# Patient Record
Sex: Male | Born: 1960 | Race: Black or African American | Hispanic: No | Marital: Single | Smoking: Never smoker
Health system: Southern US, Community
[De-identification: ages and names within clinical notes are randomized; demographics above are authoritative.]

## PROBLEM LIST (undated history)

## (undated) DIAGNOSIS — I313 Pericardial effusion (noninflammatory): Secondary | ICD-10-CM

## (undated) DIAGNOSIS — J189 Pneumonia, unspecified organism: Secondary | ICD-10-CM

## (undated) HISTORY — PX: INGUINAL HERNIA REPAIR: SUR1180

---

## 1998-12-17 ENCOUNTER — Ambulatory Visit (HOSPITAL_BASED_OUTPATIENT_CLINIC_OR_DEPARTMENT_OTHER): Admission: RE | Admit: 1998-12-17 | Discharge: 1998-12-17 | Payer: Self-pay

## 2001-07-14 ENCOUNTER — Encounter: Payer: Self-pay | Admitting: Emergency Medicine

## 2001-07-14 ENCOUNTER — Emergency Department (HOSPITAL_COMMUNITY): Admission: EM | Admit: 2001-07-14 | Discharge: 2001-07-14 | Payer: Self-pay

## 2011-11-01 ENCOUNTER — Ambulatory Visit (INDEPENDENT_AMBULATORY_CARE_PROVIDER_SITE_OTHER): Payer: 59 | Admitting: Family Medicine

## 2011-11-01 VITALS — BP 122/78 | HR 108 | Temp 97.6°F | Resp 16 | Ht 62.75 in | Wt 144.4 lb

## 2011-11-01 DIAGNOSIS — R51 Headache: Secondary | ICD-10-CM

## 2011-11-01 DIAGNOSIS — R220 Localized swelling, mass and lump, head: Secondary | ICD-10-CM

## 2011-11-01 DIAGNOSIS — R221 Localized swelling, mass and lump, neck: Secondary | ICD-10-CM

## 2011-11-01 NOTE — Progress Notes (Signed)
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  Progressive. One episode of bleeding  O:  Verrucous 4 mm lesion on right occipital scalp.  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

## 2011-11-01 NOTE — Patient Instructions (Signed)

## 2011-11-01 NOTE — Progress Notes (Signed)
VCO.  Local anesthesia with 1.5 cc 2% lidocaine with epi.  SP&D.  Elliptical excision with 11 blade.  Undermining with curved hemostats and scissors.  Closed with 4-0 Prolene, 1 HM, 1 VM and 3 SI sutures.  Cleansed and dressed.

## 2011-11-08 ENCOUNTER — Ambulatory Visit (INDEPENDENT_AMBULATORY_CARE_PROVIDER_SITE_OTHER): Payer: 59 | Admitting: Family Medicine

## 2011-11-08 VITALS — BP 112/76 | HR 76 | Temp 98.3°F | Resp 16 | Ht 63.0 in | Wt 145.2 lb

## 2011-11-08 DIAGNOSIS — Z4802 Encounter for removal of sutures: Secondary | ICD-10-CM

## 2011-11-08 NOTE — Progress Notes (Signed)
Here to have sutures removed- skin lesion removed from scalp in 11/01/11.  He is doing well and has no other complaints,  Has not yet heard about path report so I will review for him.  Removed all sutures healing well.  No bleeding or sign of infection.  Will follow- up with him once I have reviewed his path report

## 2011-11-11 ENCOUNTER — Telehealth: Payer: Self-pay | Admitting: *Deleted

## 2011-11-12 ENCOUNTER — Telehealth: Payer: Self-pay | Admitting: Radiology

## 2011-11-12 ENCOUNTER — Other Ambulatory Visit: Payer: Self-pay | Admitting: Radiology

## 2011-11-12 DIAGNOSIS — L989 Disorder of the skin and subcutaneous tissue, unspecified: Secondary | ICD-10-CM

## 2011-11-12 NOTE — Telephone Encounter (Signed)
Spoke with patient and explained Path report: possible early skin cancer.  Stressed importance of getting this removed ASAP.  Patient stated could not go to Monday 11am appointment at [facility].  Gave patient phone number to [facility] # [phone] and instructed patient to call and re-schedule appointment to a day and time that fits his schedule if he is unable to make Monday appt.  Again, stressed the importance of getting the lesion removed within the next 2 weeks.

## 2011-11-12 NOTE — Telephone Encounter (Signed)
PT CALLED AND WAS GIVEN THE INFORMATION ABOUT REFERRING HIM TO A DERM AND HE STATED HE DIDN'T WANT TO BE REFERRED AT THIS TIME

## 2011-11-12 NOTE — Telephone Encounter (Signed)
A gentleman picked up and stated that Jillyn Hidden was not at home and so I requested he have Jillyn Hidden call us today at [phone].  Gentleman agreed to do so.  I went ahead and made referral to derm and will try again to contact patient regarding skin lesion (Dr Patsy Lager states that the lesion most likely is benign but could be early skin cancer according to path report, referral to derm needed to completely excise lesion).

## 2011-11-13 NOTE — Telephone Encounter (Signed)
Old message, patient already spoke with Magda Paganini about the importance of this.

## 2011-11-16 ENCOUNTER — Other Ambulatory Visit: Payer: Self-pay | Admitting: Family Medicine

## 2011-11-16 NOTE — Progress Notes (Signed)
Received notes from [facility], MD- he did indeed follow- up with derm and was referred for surgical excision of site.

## 2014-01-14 ENCOUNTER — Ambulatory Visit (INDEPENDENT_AMBULATORY_CARE_PROVIDER_SITE_OTHER): Payer: 59 | Admitting: Family Medicine

## 2014-01-14 VITALS — BP 126/74 | HR 82 | Temp 98.0°F | Resp 16 | Ht 62.0 in | Wt 145.6 lb

## 2014-01-14 DIAGNOSIS — Z23 Encounter for immunization: Secondary | ICD-10-CM

## 2014-01-14 DIAGNOSIS — Z13220 Encounter for screening for lipoid disorders: Secondary | ICD-10-CM

## 2014-01-14 DIAGNOSIS — Z130 Encounter for screening for diseases of the blood and blood-forming organs and certain disorders involving the immune mechanism: Secondary | ICD-10-CM

## 2014-01-14 DIAGNOSIS — Z0000 Encounter for general adult medical examination without abnormal findings: Secondary | ICD-10-CM

## 2014-01-14 DIAGNOSIS — Z125 Encounter for screening for malignant neoplasm of prostate: Secondary | ICD-10-CM

## 2014-01-14 DIAGNOSIS — H269 Unspecified cataract: Secondary | ICD-10-CM

## 2014-01-14 LAB — CBC WITH DIFFERENTIAL/PLATELET
Basophils Absolute: 0 10*3/uL (ref 0.0–0.1)
Basophils Relative: 0 % (ref 0–1)
Eosinophils Absolute: 0.1 10*3/uL (ref 0.0–0.7)
Eosinophils Relative: 2 % (ref 0–5)
HCT: 44.3 % (ref 39.0–52.0)
Hemoglobin: 15.3 g/dL (ref 13.0–17.0)
Lymphocytes Relative: 27 % (ref 12–46)
Lymphs Abs: 1.2 10*3/uL (ref 0.7–4.0)
MCH: 30.1 pg (ref 26.0–34.0)
MCHC: 34.5 g/dL (ref 30.0–36.0)
MCV: 87 fL (ref 78.0–100.0)
Monocytes Absolute: 0.4 10*3/uL (ref 0.1–1.0)
Monocytes Relative: 10 % (ref 3–12)
Neutro Abs: 2.6 10*3/uL (ref 1.7–7.7)
Neutrophils Relative %: 61 % (ref 43–77)
Platelets: 263 10*3/uL (ref 150–400)
RBC: 5.09 MIL/uL (ref 4.22–5.81)
RDW: 13.4 % (ref 11.5–15.5)
WBC: 4.3 10*3/uL (ref 4.0–10.5)

## 2014-01-14 LAB — COMPREHENSIVE METABOLIC PANEL
ALT: 23 U/L (ref 0–53)
AST: 21 U/L (ref 0–37)
Albumin: 4.7 g/dL (ref 3.5–5.2)
Alkaline Phosphatase: 34 U/L — ABNORMAL LOW (ref 39–117)
BUN: 11 mg/dL (ref 6–23)
CO2: 27 mEq/L (ref 19–32)
Calcium: 9.4 mg/dL (ref 8.4–10.5)
Chloride: 100 mEq/L (ref 96–112)
Creat: 0.79 mg/dL (ref 0.50–1.35)
Glucose, Bld: 85 mg/dL (ref 70–99)
Potassium: 4.2 mEq/L (ref 3.5–5.3)
Sodium: 136 mEq/L (ref 135–145)
Total Bilirubin: 1.4 mg/dL — ABNORMAL HIGH (ref 0.2–1.2)
Total Protein: 7.5 g/dL (ref 6.0–8.3)

## 2014-01-14 LAB — LIPID PANEL
Cholesterol: 144 mg/dL (ref 0–200)
HDL: 54 mg/dL (ref >39)
LDL Cholesterol: 31 mg/dL (ref 0–99)
Total CHOL/HDL Ratio: 2.7 Ratio
Triglycerides: 295 mg/dL — ABNORMAL HIGH (ref <150)
VLDL: 59 mg/dL — ABNORMAL HIGH (ref 0–40)

## 2014-01-14 NOTE — Patient Instructions (Signed)
Our office will call you about an eye appointment for a check up.

## 2014-01-14 NOTE — Progress Notes (Signed)
   Subjective:    Patient ID: Clifford Murillo, male    DOB: 10-Feb-1961, 53 y.o.   MRN: [id number]  HPI Patient presents today for a CPE. He needs a form completed to be a [employer] participant. He does not have any other medical care outside of [facility]. Patient with mild MR. He works for the [employer], lives with his mother and uses public transportation. He plays basketball and bocce ball with [employer].   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 No pertinent past medical history. Past Surgical History  Procedure Laterality Date  . Hernia repair     Family History  Problem Relation Age of Onset  . Diabetes Mother    History  Substance Use Topics  . Smoking status: Never Smoker   . Smokeless tobacco: Not on file  . Alcohol Use: Not on file       Review of Systems  All other systems reviewed and are negative.      Objective:   Physical Exam  Vitals reviewed. Constitutional: He is oriented to person, place, and time. He appears well-developed and well-nourished. No distress.  HENT:  Head: Normocephalic and atraumatic.  Right Ear: Tympanic membrane, external ear and ear canal normal.  Left Ear: Tympanic membrane, external ear and ear canal normal.  Nose: Nose normal.  Mouth/Throat: Oropharynx is clear and moist.  Eyes: Conjunctivae and EOM are normal. Pupils are equal, round, and reactive to light. Right eye exhibits no discharge. Left eye exhibits no discharge. No scleral icterus.  Bilateral eyes with small amount bilateral corneal cloudiness.   Neck: Normal range of motion. Neck supple. No JVD present. No thyromegaly present.  Cardiovascular: Normal rate, regular rhythm, normal heart sounds and intact distal pulses.   Pulmonary/Chest: Effort normal and breath sounds normal.  Abdominal: Soft. Bowel  sounds are normal. Hernia confirmed negative in the right inguinal area and confirmed negative in the left inguinal area.  Genitourinary: Testes normal and penis normal. Right testis shows no mass, no swelling and no tenderness. Right testis is descended. Left testis shows no mass, no swelling and no tenderness. Left testis is descended. Circumcised. No penile erythema or penile tenderness. No discharge found.  Musculoskeletal: Normal range of motion.  Lymphadenopathy:    He has no cervical adenopathy.       Right: No inguinal adenopathy present.       Left: No inguinal adenopathy present.  Neurological: He is alert and oriented to person, place, and time. He has normal reflexes.  Skin: Skin is warm and dry. He is not diaphoretic.  Psychiatric: He has a normal mood and affect. His behavior is normal. Judgment and thought content normal.      Assessment & Plan:  1. Routine general medical examination at a health care facility - Comprehensive metabolic panel  2. Screening for lipid disorders - Lipid panel  3. Screening for deficiency anemia - CBC with Differential  4. Screening for prostate cancer - PSA  5. Need for prophylactic vaccination with combined diphtheria-tetanus-pertussis (DTP) vaccine - Tdap vaccine greater than or equal to 7yo IM  6. Cataracts, bilateral - Ambulatory referral to Ophthalmology   Elby Beck, FNP-BC  [facility]  01/16/2014 10:24 PM

## 2014-01-15 LAB — PSA: PSA: 3.77 ng/mL (ref <=4.00)

## 2014-01-22 ENCOUNTER — Other Ambulatory Visit: Payer: Self-pay | Admitting: Family Medicine

## 2014-01-22 DIAGNOSIS — Z125 Encounter for screening for malignant neoplasm of prostate: Secondary | ICD-10-CM

## 2014-01-25 ENCOUNTER — Telehealth: Payer: Self-pay

## 2014-09-09 ENCOUNTER — Ambulatory Visit (INDEPENDENT_AMBULATORY_CARE_PROVIDER_SITE_OTHER): Payer: 59

## 2014-09-09 ENCOUNTER — Ambulatory Visit (INDEPENDENT_AMBULATORY_CARE_PROVIDER_SITE_OTHER): Payer: 59 | Admitting: Family Medicine

## 2014-09-09 VITALS — BP 130/80 | HR 130 | Temp 98.8°F | Resp 16 | Ht 62.0 in | Wt 153.0 lb

## 2014-09-09 DIAGNOSIS — R0989 Other specified symptoms and signs involving the circulatory and respiratory systems: Secondary | ICD-10-CM | POA: Diagnosis not present

## 2014-09-09 DIAGNOSIS — R05 Cough: Secondary | ICD-10-CM | POA: Diagnosis not present

## 2014-09-09 DIAGNOSIS — I517 Cardiomegaly: Secondary | ICD-10-CM | POA: Diagnosis not present

## 2014-09-09 DIAGNOSIS — R059 Cough, unspecified: Secondary | ICD-10-CM

## 2014-09-09 DIAGNOSIS — R000 Tachycardia, unspecified: Secondary | ICD-10-CM

## 2014-09-09 LAB — COMPLETE METABOLIC PANEL WITH GFR
ALT: 77 U/L — ABNORMAL HIGH (ref 0–53)
AST: 47 U/L — ABNORMAL HIGH (ref 0–37)
Albumin: 3.9 g/dL (ref 3.5–5.2)
Alkaline Phosphatase: 99 U/L (ref 39–117)
BUN: 10 mg/dL (ref 6–23)
CO2: 22 mEq/L (ref 19–32)
Calcium: 8.8 mg/dL (ref 8.4–10.5)
Chloride: 88 mEq/L — ABNORMAL LOW (ref 96–112)
Creat: 0.73 mg/dL (ref 0.50–1.35)
GFR, Est African American: >89 mL/min
GFR, Est Non African American: >89 mL/min
Glucose, Bld: 123 mg/dL — ABNORMAL HIGH (ref 70–99)
Potassium: 4.6 mEq/L (ref 3.5–5.3)
Sodium: 128 mEq/L — ABNORMAL LOW (ref 135–145)
Total Bilirubin: 4.8 mg/dL — ABNORMAL HIGH (ref 0.2–1.2)
Total Protein: 7.4 g/dL (ref 6.0–8.3)

## 2014-09-09 LAB — POCT CBC
Granulocyte percent: 85.5 %G — AB (ref 37–80)
HCT, POC: 34.8 % — AB (ref 43.5–53.7)
Hemoglobin: 11.5 g/dL — AB (ref 14.1–18.1)
Lymph, poc: 1.4 (ref 0.6–3.4)
MCH, POC: 28.9 pg (ref 27–31.2)
MCHC: 33.1 g/dL (ref 31.8–35.4)
MCV: 87.4 fL (ref 80–97)
MID (cbc): 0.6 (ref 0–0.9)
MPV: 6.3 fL (ref 0–99.8)
POC Granulocyte: 11.6 — AB (ref 2–6.9)
POC LYMPH PERCENT: 10.2 %L (ref 10–50)
POC MID %: 4.3 %M (ref 0–12)
Platelet Count, POC: 583 10*3/uL — AB (ref 142–424)
RBC: 3.99 M/uL — AB (ref 4.69–6.13)
RDW, POC: 13.5 %
WBC: 13.6 10*3/uL — AB (ref 4.6–10.2)

## 2014-09-09 LAB — TSH: TSH: 1.814 u[IU]/mL (ref 0.350–4.500)

## 2014-09-09 LAB — T4, FREE: Free T4: 1.23 ng/dL (ref 0.80–1.80)

## 2014-09-09 LAB — T3, FREE: T3, Free: 2.7 pg/mL (ref 2.3–4.2)

## 2014-09-09 MED ORDER — HYDROCODONE-HOMATROPINE 5-1.5 MG/5ML PO SYRP: 5.0000 mL | ORAL_SOLUTION | Freq: Three times a day (TID) | ORAL | Status: DC | PRN

## 2014-09-09 MED ORDER — AZITHROMYCIN 250 MG PO TABS: ORAL_TABLET | ORAL | Status: DC

## 2014-09-09 NOTE — Patient Instructions (Signed)
You have a significant enlargement of your heart. This requires that you see a cardiologist to better understand why this is happening. At the same time you have what sounds like a pneumonia in the left chest. For this reason I want to stay out of work for the next 2 days and come back on Wednesday so we can listen to your chest again  In the meantime I want you to start on antibiotics and cough medicine and stay out of work.

## 2014-09-09 NOTE — Progress Notes (Addendum)
Subjective:  This chart was scribed for Robyn Haber MD, by Tamsen Roers, at [facility].  This patient was seen in room 8 and the patient's care was started at 11:06 AM.    Patient ID: Clifford Murillo, male    DOB: 1960-09-23, 54 y.o.   MRN: [id number] Chief Complaint  Patient presents with   Cough    Onset 2 weeks    HPI  HPI Comments: Clifford Murillo is a 54 y.o. male who presents to the [facility] complaining of a dry cough onset two weeks ago.  Patient has assocaited symtoms of a fever (1 time) and intermittent chest pain.  He has never had asthma and is not a smoker.  Patient's dentures do not bother him.  Patient works in [employer].  Denies abdominal pain, congestion.   There are no active problems to display for this patient.  No past medical history on file. Past Surgical History  Procedure Laterality Date   Hernia repair     No Known Allergies Prior to Admission medications   Not on File   History   Social History   Marital Status: Single    Spouse Name: N/A   Number of Children: N/A   Years of Education: N/A   Occupational History   Not on file.   Social History Main Topics   Smoking status: Never Smoker    Smokeless tobacco: Not on file   Alcohol Use: Not on file   Drug Use: Not on file   Sexual Activity: Not on file   Other Topics Concern   Not on file   Social History Narrative    No current outpatient prescriptions on file prior to visit.   No current facility-administered medications on file prior to visit.    No Known Allergies  Review of Systems  HENT: Negative for congestion.   Respiratory: Positive for cough.   Cardiovascular: Positive for chest pain.  Gastrointestinal: Negative for vomiting, abdominal pain and abdominal distention.       Objective:   Physical Exam  Constitutional: He appears well-developed and well-nourished. No distress.   HENT:  Head: Normocephalic and atraumatic.  Eyes: Right eye exhibits no discharge. Left eye exhibits no discharge.  Pulmonary/Chest: Effort normal. No respiratory distress. He has rales.  Left sided rales.  Musculoskeletal:  Small hands noted.  he has a simean crease in his right hand.   Neurological: He is alert. Coordination normal.  Skin: No rash noted. He is not diaphoretic.  Nursing note and vitals reviewed.  (simian crease) Filed Vitals:   09/09/14 1044  BP: 130/80  Pulse: 130  Temp: 98.8 F (37.1 C)  TempSrc: Oral  Resp: 16  Height: '5\' 2"'  (1.575 m)  Weight: 153 lb (69.4 kg)  SpO2: 96%    Results for orders placed or performed in visit on 01/14/14  CBC with Differential  Result Value Ref Range   WBC 4.3 4.0 - 10.5 K/uL   RBC 5.09 4.22 - 5.81 MIL/uL   Hemoglobin 15.3 13.0 - 17.0 g/dL   HCT 44.3 39.0 - 52.0 %   MCV 87.0 78.0 - 100.0 fL   MCH 30.1 26.0 - 34.0 pg   MCHC 34.5 30.0 - 36.0 g/dL   RDW 13.4 11.5 - 15.5 %   Platelets 263 150 - 400 K/uL   Neutrophils Relative % 61 43 - 77 %   Neutro Abs 2.6 1.7 - 7.7 K/uL  Lymphocytes Relative 27 12 - 46 %   Lymphs Abs 1.2 0.7 - 4.0 K/uL   Monocytes Relative 10 3 - 12 %   Monocytes Absolute 0.4 0.1 - 1.0 K/uL   Eosinophils Relative 2 0 - 5 %   Eosinophils Absolute 0.1 0.0 - 0.7 K/uL   Basophils Relative 0 0 - 1 %   Basophils Absolute 0.0 0.0 - 0.1 K/uL   Smear Review Criteria for review not met   Comprehensive metabolic panel  Result Value Ref Range   Sodium 136 135 - 145 mEq/L   Potassium 4.2 3.5 - 5.3 mEq/L   Chloride 100 96 - 112 mEq/L   CO2 27 19 - 32 mEq/L   Glucose, Bld 85 70 - 99 mg/dL   BUN 11 6 - 23 mg/dL   Creat 0.79 0.50 - 1.35 mg/dL   Total Bilirubin 1.4 (H) 0.2 - 1.2 mg/dL   Alkaline Phosphatase 34 (L) 39 - 117 U/L   AST 21 0 - 37 U/L   ALT 23 0 - 53 U/L   Total Protein 7.5 6.0 - 8.3 g/dL   Albumin 4.7 3.5 - 5.2 g/dL   Calcium 9.4 8.4 - 10.5 mg/dL  Lipid panel  Result Value Ref Range    Cholesterol 144 0 - 200 mg/dL   Triglycerides 295 (H) <150 mg/dL   HDL 54 >39 mg/dL   Total CHOL/HDL Ratio 2.7 Ratio   VLDL 59 (H) 0 - 40 mg/dL   LDL Cholesterol 31 0 - 99 mg/dL  PSA  Result Value Ref Range   PSA 3.77 <=4.00 ng/mL   [facility] reading (PRIMARY) by  Dr. Joseph Art:  Chest x-ray shows marked cardiomegaly. There may be a small pneumonia in the left lower lobe but it's difficult to see because of the cardiac shadow.   Patient kept in the office for over an hour while tests were run and labs and x-ray were reviewed along with patient discussion.     Assessment & Plan:    This chart was scribed in my presence and reviewed by me personally.    ICD-9-CM ICD-10-CM   1. Cough 786.2 R05 azithromycin (ZITHROMAX Z-PAK) 250 MG tablet     HYDROcodone-homatropine (HYCODAN) 5-1.5 MG/5ML syrup     DG Chest 2 View     DG Chest 2 View  2. Rales 786.7 R09.89 azithromycin (ZITHROMAX Z-PAK) 250 MG tablet     HYDROcodone-homatropine (HYCODAN) 5-1.5 MG/5ML syrup     DG Chest 2 View     DG Chest 2 View  3. Tachycardia 785.0 R00.0 DG Chest 2 View     DG Chest 2 View     EKG 12-Lead     EKG 12-Lead     POCT CBC     T4, free     T3, free     TSH     COMPLETE METABOLIC PANEL WITH GFR   Signed, Robyn Haber, MD   CXR reading confirms the LLL infiltrate and cardiomegaly Results for orders placed or performed in visit on 09/09/14  POCT CBC  Result Value Ref Range   WBC 13.6 (A) 4.6 - 10.2 K/uL   Lymph, poc 1.4 0.6 - 3.4   POC LYMPH PERCENT 10.2 10 - 50 %L   MID (cbc) 0.6 0 - 0.9   POC MID % 4.3 0 - 12 %M   POC Granulocyte 11.6 (A) 2 - 6.9   Granulocyte percent 85.5 (A) 37 - 80 %G   RBC 3.99 (A)  4.69 - 6.13 M/uL   Hemoglobin 11.5 (A) 14.1 - 18.1 g/dL   HCT, POC 34.8 (A) 43.5 - 53.7 %   MCV 87.4 80 - 97 fL   MCH, POC 28.9 27 - 31.2 pg   MCHC 33.1 31.8 - 35.4 g/dL   RDW, POC 13.5 %   Platelet Count, POC 583 (A) 142 - 424 K/uL   MPV 6.3 0 - 99.8 fL   Robyn Haber

## 2014-09-11 ENCOUNTER — Inpatient Hospital Stay (HOSPITAL_COMMUNITY)
Admission: EM | Admit: 2014-09-11 | Discharge: 2014-09-14 | DRG: 314 | Disposition: A | Discharge status: Home / Self Care | Payer: 59 | Attending: Internal Medicine | Admitting: Internal Medicine

## 2014-09-11 ENCOUNTER — Encounter (HOSPITAL_COMMUNITY): Payer: Self-pay | Admitting: Family Medicine

## 2014-09-11 ENCOUNTER — Other Ambulatory Visit (HOSPITAL_COMMUNITY): Payer: Self-pay

## 2014-09-11 ENCOUNTER — Ambulatory Visit (INDEPENDENT_AMBULATORY_CARE_PROVIDER_SITE_OTHER): Payer: 59

## 2014-09-11 ENCOUNTER — Ambulatory Visit (INDEPENDENT_AMBULATORY_CARE_PROVIDER_SITE_OTHER): Payer: 59 | Admitting: Family Medicine

## 2014-09-11 ENCOUNTER — Emergency Department (HOSPITAL_COMMUNITY): Payer: 59

## 2014-09-11 ENCOUNTER — Other Ambulatory Visit: Payer: Self-pay

## 2014-09-11 VITALS — BP 132/80 | HR 122 | Temp 98.0°F | Resp 17 | Ht 63.0 in | Wt 147.0 lb

## 2014-09-11 DIAGNOSIS — R945 Abnormal results of liver function studies: Secondary | ICD-10-CM

## 2014-09-11 DIAGNOSIS — I517 Cardiomegaly: Secondary | ICD-10-CM

## 2014-09-11 DIAGNOSIS — J189 Pneumonia, unspecified organism: Secondary | ICD-10-CM | POA: Diagnosis present

## 2014-09-11 DIAGNOSIS — W06XXXA Fall from bed, initial encounter: Secondary | ICD-10-CM | POA: Diagnosis present

## 2014-09-11 DIAGNOSIS — J90 Pleural effusion, not elsewhere classified: Secondary | ICD-10-CM | POA: Diagnosis present

## 2014-09-11 DIAGNOSIS — D649 Anemia, unspecified: Secondary | ICD-10-CM | POA: Diagnosis present

## 2014-09-11 DIAGNOSIS — R0600 Dyspnea, unspecified: Secondary | ICD-10-CM | POA: Diagnosis not present

## 2014-09-11 DIAGNOSIS — R740 Nonspecific elevation of levels of transaminase and lactic acid dehydrogenase [LDH]: Secondary | ICD-10-CM | POA: Diagnosis present

## 2014-09-11 DIAGNOSIS — D75839 Thrombocytosis, unspecified: Secondary | ICD-10-CM | POA: Diagnosis present

## 2014-09-11 DIAGNOSIS — R7989 Other specified abnormal findings of blood chemistry: Secondary | ICD-10-CM | POA: Diagnosis not present

## 2014-09-11 DIAGNOSIS — E871 Hypo-osmolality and hyponatremia: Secondary | ICD-10-CM | POA: Diagnosis present

## 2014-09-11 DIAGNOSIS — D473 Essential (hemorrhagic) thrombocythemia: Secondary | ICD-10-CM

## 2014-09-11 DIAGNOSIS — E876 Hypokalemia: Secondary | ICD-10-CM | POA: Diagnosis present

## 2014-09-11 DIAGNOSIS — R040 Epistaxis: Secondary | ICD-10-CM | POA: Diagnosis not present

## 2014-09-11 DIAGNOSIS — J181 Lobar pneumonia, unspecified organism: Principal | ICD-10-CM

## 2014-09-11 DIAGNOSIS — I309 Acute pericarditis, unspecified: Secondary | ICD-10-CM | POA: Diagnosis not present

## 2014-09-11 DIAGNOSIS — R000 Tachycardia, unspecified: Secondary | ICD-10-CM | POA: Diagnosis not present

## 2014-09-11 DIAGNOSIS — H1131 Conjunctival hemorrhage, right eye: Secondary | ICD-10-CM | POA: Diagnosis present

## 2014-09-11 DIAGNOSIS — I313 Pericardial effusion (noninflammatory): Principal | ICD-10-CM | POA: Diagnosis present

## 2014-09-11 DIAGNOSIS — S0011XA Contusion of right eyelid and periocular area, initial encounter: Secondary | ICD-10-CM | POA: Diagnosis present

## 2014-09-11 DIAGNOSIS — S0511XA Contusion of eyeball and orbital tissues, right eye, initial encounter: Secondary | ICD-10-CM | POA: Diagnosis present

## 2014-09-11 DIAGNOSIS — I3139 Other pericardial effusion (noninflammatory): Secondary | ICD-10-CM | POA: Diagnosis present

## 2014-09-11 DIAGNOSIS — H1130 Conjunctival hemorrhage, unspecified eye: Secondary | ICD-10-CM | POA: Diagnosis not present

## 2014-09-11 DIAGNOSIS — E870 Hyperosmolality and hypernatremia: Secondary | ICD-10-CM

## 2014-09-11 DIAGNOSIS — I319 Disease of pericardium, unspecified: Secondary | ICD-10-CM | POA: Diagnosis not present

## 2014-09-11 DIAGNOSIS — R0602 Shortness of breath: Secondary | ICD-10-CM

## 2014-09-11 DIAGNOSIS — J9811 Atelectasis: Secondary | ICD-10-CM | POA: Diagnosis present

## 2014-09-11 HISTORY — DX: Pneumonia, unspecified organism: J18.9

## 2014-09-11 HISTORY — DX: Pericardial effusion (noninflammatory): I31.3

## 2014-09-11 HISTORY — DX: Other pericardial effusion (noninflammatory): I31.39

## 2014-09-11 LAB — RAPID URINE DRUG SCREEN, HOSP PERFORMED
AMPHETAMINES: NOT DETECTED
BENZODIAZEPINES: NOT DETECTED
Barbiturates: NOT DETECTED
Cocaine: NOT DETECTED
Opiates: NOT DETECTED
TETRAHYDROCANNABINOL: NOT DETECTED

## 2014-09-11 LAB — ETHANOL

## 2014-09-11 LAB — BILIRUBIN, FRACTIONATED(TOT/DIR/INDIR)
BILIRUBIN INDIRECT: 2.3 mg/dL — AB (ref 0.3–0.9)
Bilirubin, Direct: 3.7 mg/dL — ABNORMAL HIGH (ref 0.1–0.5)
Total Bilirubin: 6 mg/dL — ABNORMAL HIGH (ref 0.3–1.2)

## 2014-09-11 LAB — D-DIMER, QUANTITATIVE (NOT AT ARMC): D-Dimer, Quant: 19.97 ug/mL-FEU — ABNORMAL HIGH (ref 0.00–0.48)

## 2014-09-11 LAB — I-STAT TROPONIN, ED: TROPONIN I, POC: 0.01 ng/mL (ref 0.00–0.08)

## 2014-09-11 LAB — BASIC METABOLIC PANEL
Anion gap: 14 (ref 5–15)
BUN: 8 mg/dL (ref 6–20)
CALCIUM: 8.6 mg/dL — AB (ref 8.9–10.3)
CO2: 26 mmol/L (ref 22–32)
Chloride: 91 mmol/L — ABNORMAL LOW (ref 101–111)
Creatinine, Ser: 0.86 mg/dL (ref 0.61–1.24)
GFR calc Af Amer: >60 mL/min (ref >60)
Glucose, Bld: 110 mg/dL — ABNORMAL HIGH (ref 65–99)
Potassium: 3.4 mmol/L — ABNORMAL LOW (ref 3.5–5.1)
SODIUM: 131 mmol/L — AB (ref 135–145)

## 2014-09-11 LAB — CBC
HCT: 37.1 % — ABNORMAL LOW (ref 39.0–52.0)
Hemoglobin: 12.7 g/dL — ABNORMAL LOW (ref 13.0–17.0)
MCH: 29.5 pg (ref 26.0–34.0)
MCHC: 34.2 g/dL (ref 30.0–36.0)
MCV: 86.1 fL (ref 78.0–100.0)
Platelets: 623 10*3/uL — ABNORMAL HIGH (ref 150–400)
RBC: 4.31 MIL/uL (ref 4.22–5.81)
RDW: 12.7 % (ref 11.5–15.5)
WBC: 14.9 10*3/uL — ABNORMAL HIGH (ref 4.0–10.5)

## 2014-09-11 LAB — BRAIN NATRIURETIC PEPTIDE: B NATRIURETIC PEPTIDE 5: 129.2 pg/mL — AB (ref 0.0–100.0)

## 2014-09-11 LAB — TROPONIN I

## 2014-09-11 LAB — CK TOTAL AND CKMB (NOT AT ARMC)
CK TOTAL: 333 U/L (ref 49–397)
CK, MB: 3.1 ng/mL (ref 0.5–5.0)
Relative Index: 0.9 (ref 0.0–2.5)

## 2014-09-11 LAB — PROTIME-INR
INR: 1.2 (ref 0.00–1.49)
Prothrombin Time: 15.4 seconds — ABNORMAL HIGH (ref 11.6–15.2)

## 2014-09-11 LAB — MAGNESIUM: MAGNESIUM: 2.7 mg/dL — AB (ref 1.7–2.4)

## 2014-09-11 MED ORDER — GUAIFENESIN-DM 100-10 MG/5ML PO SYRP
5.0000 mL | ORAL_SOLUTION | ORAL | Status: DC | PRN
  Administered 2014-09-12: 5 mL via ORAL
  Filled 2014-09-11: qty 5

## 2014-09-11 MED ORDER — SODIUM CHLORIDE 0.9 % IJ SOLN
3.0000 mL | Freq: Two times a day (BID) | INTRAMUSCULAR | Status: DC
  Administered 2014-09-11 – 2014-09-14 (×6): 3 mL via INTRAVENOUS

## 2014-09-11 MED ORDER — POTASSIUM CHLORIDE CRYS ER 20 MEQ PO TBCR
40.0000 meq | EXTENDED_RELEASE_TABLET | Freq: Once | ORAL | Status: AC
  Administered 2014-09-11: 40 meq via ORAL
  Filled 2014-09-11: qty 2

## 2014-09-11 MED ORDER — AZITHROMYCIN 250 MG PO TABS
250.0000 mg | ORAL_TABLET | Freq: Every day | ORAL | Status: AC
  Administered 2014-09-12 – 2014-09-13 (×2): 250 mg via ORAL
  Filled 2014-09-11 (×2): qty 1

## 2014-09-11 MED ORDER — IOHEXOL 350 MG/ML SOLN
100.0000 mL | Freq: Once | INTRAVENOUS | Status: AC | PRN
  Administered 2014-09-11: 100 mL via INTRAVENOUS

## 2014-09-11 NOTE — ED Notes (Signed)
Spoke to CT about IV placement for scan.  CT sts they need AC IV.  Dr. Melina Modena to attempt [place] IV

## 2014-09-11 NOTE — ED Notes (Signed)
Pt has blackened right eye.  Pt sts he woke up on the floor beside his bed yesterday.  Sts he does not recall falling out of the bed but "this has happened before".  Denies pain to eye, denies decreased/blurred vision, not tender to palpation.

## 2014-09-11 NOTE — ED Notes (Signed)
Dr. Rogers Blocker made aware of pts saturations and D-dimer results

## 2014-09-11 NOTE — ED Notes (Signed)
Pt O2 sat 82%, placed on 2L nasal cannula, o2 96%

## 2014-09-11 NOTE — ED Notes (Signed)
Pt saturations reading 82% with good pleth.  This RN and Lovena Le, NT placed pt on 2L and slid pt up in bed for better oxygenation.  Saturations now reading 95% at this time.

## 2014-09-11 NOTE — ED Provider Notes (Signed)
CSN: [id number]     Arrival date & time 09/11/14  1448 History   First MD Initiated Contact with Patient 09/11/14 1513     Chief Complaint  Patient presents with  . Chest Pain  . Cough     (Consider location/radiation/quality/duration/timing/severity/associated sxs/prior Treatment) Patient is a 54 y.o. male presenting with cough. The history is provided by the patient.  Cough Cough characteristics:  Non-productive Severity:  Mild Onset quality:  Gradual Duration:  3 days Timing:  Constant Progression:  Improving Chronicity:  New Context comment:  PNA diagnosed 2 days ago Relieved by: antibiotics. Worsened by:  Nothing tried Ineffective treatments:  None tried Associated symptoms: no chest pain, no chills, no diaphoresis, no fever, no headaches, no myalgias, no rash, no rhinorrhea, no shortness of breath and no wheezing     Past Medical History  Diagnosis Date  . CAP (community acquired pneumonia) 09/11/2014  . Pericardial effusion 09/11/2014    [name] 09/11/2014   Past Surgical History  Procedure Laterality Date  . Inguinal hernia repair Bilateral    Family History  Problem Relation Age of Onset  . Diabetes Mother    History  Substance Use Topics  . Smoking status: Never Smoker   . Smokeless tobacco: Never Used  . Alcohol Use: No    Review of Systems  Constitutional: Negative for fever, chills, diaphoresis, appetite change and fatigue.  HENT: Negative for rhinorrhea.   Respiratory: Positive for cough. Negative for chest tightness, shortness of breath and wheezing.   Cardiovascular: Negative for chest pain, palpitations and leg swelling.  Gastrointestinal: Negative for nausea, vomiting, abdominal pain, diarrhea and abdominal distention.  Musculoskeletal: Negative for myalgias, back pain, neck pain and neck stiffness.  Skin: Negative for color change, pallor and rash.  Neurological: Negative for dizziness, light-headedness and headaches.  All other systems reviewed  and are negative.     Allergies  Review of patient's allergies indicates no known allergies.  Home Medications   Prior to Admission medications   Medication Sig Start Date End Date Taking? Authorizing Provider  azithromycin (ZITHROMAX Z-PAK) 250 MG tablet 2 today, then one daily 09/09/14  Yes Robyn Haber, MD  HYDROcodone-homatropine [facility]) 5-1.5 MG/5ML syrup Take 5 mLs by mouth every 8 (eight) hours as needed for cough. 09/09/14  Yes Robyn Haber, MD   BP 119/85 mmHg  Pulse 105  Temp(Src) 97.7 F (36.5 C) (Oral)  Resp 18  Ht 5\' 2"  (1.575 m)  Wt 138 lb 4.8 oz (62.732 kg)  BMI 25.29 kg/m2  SpO2 100% Physical Exam  Constitutional: He is oriented to person, place, and time. He appears well-developed and well-nourished. No distress.  HENT:  Head: Normocephalic and atraumatic.  Mouth/Throat: Oropharynx is clear and moist.  R periorbital bruising and subconjunctival hemorrhage No pain with eye movement.  Periorbital area non-tender, no bony deformity.  No evidence of entrapment.   Eyes: EOM are normal. Pupils are equal, round, and reactive to light. Right conjunctiva has a hemorrhage.  Neck: Normal range of motion. Neck supple.  Cardiovascular: Regular rhythm, normal heart sounds and intact distal pulses.  Tachycardia present.  Exam reveals no gallop and no friction rub.   No murmur heard. Pulmonary/Chest: Effort normal and breath sounds normal. No respiratory distress. He has no wheezes. He has no rales.  Abdominal: Soft. Bowel sounds are normal. He exhibits no distension. There is no tenderness. There is no rebound and no guarding.  Musculoskeletal: Normal range of motion. He exhibits no edema or tenderness.  Neurological: He is alert and oriented to person, place, and time. GCS eye subscore is 4. GCS verbal subscore is 5. GCS motor subscore is 6.  Skin: Skin is warm and dry. No rash noted. He is not diaphoretic. No erythema. No pallor.  Nursing note and vitals  reviewed.   ED Course  Procedures (including critical care time) Labs Review Labs Reviewed  CBC - Abnormal; Notable for the following:    WBC 14.9 (*)    Hemoglobin 12.7 (*)    HCT 37.1 (*)    Platelets 623 (*)    All other components within normal limits  BASIC METABOLIC PANEL - Abnormal; Notable for the following:    Sodium 131 (*)    Potassium 3.4 (*)    Chloride 91 (*)    Glucose, Bld 110 (*)    Calcium 8.6 (*)    All other components within normal limits  BRAIN NATRIURETIC PEPTIDE - Abnormal; Notable for the following:    B Natriuretic Peptide 129.2 (*)    All other components within normal limits  D-DIMER, QUANTITATIVE - Abnormal; Notable for the following:    D-Dimer, Quant 19.97 (*)    All other components within normal limits  PROTIME-INR - Abnormal; Notable for the following:    Prothrombin Time 15.4 (*)    All other components within normal limits  MAGNESIUM - Abnormal; Notable for the following:    Magnesium 2.7 (*)    All other components within normal limits  BILIRUBIN, FRACTIONATED(TOT/DIR/INDIR) - Abnormal; Notable for the following:    Total Bilirubin 6.0 (*)    Bilirubin, Direct 3.7 (*)    Indirect Bilirubin 2.3 (*)    All other components within normal limits  BASIC METABOLIC PANEL - Abnormal; Notable for the following:    Chloride 99 (*)    Glucose, Bld 112 (*)    Calcium 8.0 (*)    All other components within normal limits  PROTIME-INR - Abnormal; Notable for the following:    Prothrombin Time 15.8 (*)    All other components within normal limits  C-REACTIVE PROTEIN - Abnormal; Notable for the following:    CRP 38.5 (*)    All other components within normal limits  HEPATIC FUNCTION PANEL - Abnormal; Notable for the following:    Albumin 2.7 (*)    AST 86 (*)    ALT 96 (*)    Alkaline Phosphatase 136 (*)    Total Bilirubin 5.0 (*)    Bilirubin, Direct 3.2 (*)    Indirect Bilirubin 1.8 (*)    All other components within normal limits   SEDIMENTATION RATE - Abnormal; Notable for the following:    Sed Rate 84 (*)    All other components within normal limits  BASIC METABOLIC PANEL - Abnormal; Notable for the following:    Sodium 133 (*)    Chloride 97 (*)    Calcium 8.2 (*)    All other components within normal limits  HEPATIC FUNCTION PANEL - Abnormal; Notable for the following:    Albumin 2.4 (*)    AST 48 (*)    ALT 76 (*)    Total Bilirubin 3.2 (*)    Bilirubin, Direct 1.8 (*)    Indirect Bilirubin 1.4 (*)    All other components within normal limits  CBC WITH DIFFERENTIAL/PLATELET - Abnormal; Notable for the following:    RBC 4.19 (*)    Hemoglobin 12.1 (*)    HCT 36.4 (*)    Platelets 594 (*)  Monocytes Relative 13 (*)    Monocytes Absolute 1.4 (*)    All other components within normal limits  MRSA PCR SCREENING  HIV ANTIBODY (ROUTINE TESTING)  TROPONIN I  TROPONIN I  TROPONIN I  CK TOTAL AND CKMB  HEPATITIS PANEL, ACUTE  URINE RAPID DRUG SCREEN (HOSP PERFORMED)  ETHANOL  MAGNESIUM  OCCULT BLOOD X 1 CARD TO LAB, STOOL  I-STAT TROPOININ, ED    Imaging Review Ct Angio Chest Pe W/cm &/or Wo Cm  09/11/2014   CLINICAL DATA:  Shortness of breath for 2 days.  EXAM: CT ANGIOGRAPHY CHEST WITH CONTRAST  TECHNIQUE: Multidetector CT imaging of the chest was performed using the standard protocol during bolus administration of intravenous contrast. Multiplanar CT image reconstructions and MIPs were obtained to evaluate the vascular anatomy.  CONTRAST:  14mL OMNIPAQUE IOHEXOL 350 MG/ML SOLN  COMPARISON:  None.  FINDINGS: No pulmonary embolus is identified. The patient has a moderately large pericardial effusion. Small right and small to moderate left pleural effusions are also identified. Heart size is normal. No supraclavicular, axillary, hilar or mediastinal lymphadenopathy is identified.  The lungs demonstrate only atelectatic change, more notable on the left.  Visualized upper abdomen is unremarkable. No focal  bony abnormality is identified.  Review of the MIP images confirms the above findings.  IMPRESSION: Negative for pulmonary embolus.  Moderately large pericardial effusion. Small right and moderate left pleural effusions are also identified. Associated compressive atelectasis is worse on the left.   Electronically Signed   By: Inge Rise M.D.   On: 09/11/2014 20:56     EKG Interpretation None      MDM   Final diagnoses:  SOB (shortness of breath)    54 yo M with no significant PMH presenting from urgent care due to tachycardia, cough.  Pt reports onset of cough 2 days ago with mild fever.  Went to urgent care and diagnosed with PNA, started on Azithromycin.  Returned to urgent care today for routine f/u where he was noted to have HR in 130s.  CXR obtained there showed cardiomegaly which was present and unchanged from initial CXR 2 days ago;  His infiltrate has improved.  Pt reports improvement in cough.  He denies chest pain, SOB, palpitations, dizziness.    On presentation, pt afebrile, HR 120s-130s- sinus tachycardia without acute ischemic changes on EKG. Pt well appearing, in NAD. Lungs CTAB.  Abdomen soft, non-tender. No LE edema.  Pt has R periorbital bruising and subconjunctival hemorrhage- states he fell out of bed last night.  Denies HA, neck pain, vision changes.  No pain with eye movement.  Periorbital area non-tender, no bony deformity.  No evidence of entrapment.  Exam otherwise WNL.  Possible persistent PNA/sepsis, vs PE given tachycardia.  Plan for labs, d-dimer.  D-dimer elevated to 19.  CTA chest obtained- no PE however shows moderate-large pericardial effusion.  Pt normotensive, no JVD, no evidence of tamponade physiology.  Will admit to Medicine for further management.  No other acute events during my care.  Discussed with attending Dr. Winfred Leeds.    Ellwood Dense, MD 09/13/14 [place], MD 09/14/14 [id number]

## 2014-09-11 NOTE — Patient Instructions (Signed)
Go to the hospital emergency room.  Tell them that you have a rapid heart rate and an enlarged heart, and pneumonia which feels better. However you were directed that you needed to go to the emergency room to get further evaluated for the heart enlargement and heart rate.

## 2014-09-11 NOTE — ED Notes (Addendum)
When this RN went to do rectal temp on pt, pt's underwear were found to be wet and have some very light amount of yellow stool in his underwear.  Pt removed from underwear and pad placed.  Pt sts he thinks he spilled the urine while attempting to use the urinal.  Denies any loss of control of bowels or bladder.

## 2014-09-11 NOTE — ED Provider Notes (Signed)
Complains of nonproductive cough onset 3 days ago. Coming by some shortness of breath. Denies fever. Seen at urgent care center prior to coming here. Sent here due to tachycardia.. Patient states his cough and breathing have improved with time. No other associated symptoms. On exam no distress neck supple no JVD no bruit lungs Rales at bases, coughing occasionally. heart tachycardic regular rhythm extremities without edema  Orlie Dakin, MD 09/11/14 [phone]

## 2014-09-11 NOTE — Progress Notes (Signed)
Subjective: 54 year old man who was here 2 days ago. He has significant cardiomegaly on chest x-ray and was tachycardic. He does have a left lower lobe infiltrate and was treated for a pneumonia and told to come back in 2 days. He is being treated with azithromycin. Referral was made to cardiology, but he has not yet heard anything from the left lateral. He is feeling better than he was 2 days ago.  Objective: Alert and oriented. His chest has some rhonchi at the left side. He is tachycardic still with heart rate of 122. Pulse oximetry was 93 when he came in but I repeated it and it is 95. It was 96 the other day.  EKG has sinus tachycardia with frequent PACs, rate 120s.  [facility] reading (PRIMARY) by  Dr. Linna Darner Chest x-ray reveals left-sided infiltrate and significant cardiomegaly. The cardiomegaly looks the same but the infiltrate may be a tiny bit better from 2 days ago.  Assessment: Left lower lobe pneumonia Cardiomegaly/possible pericardial effusion Tachycardia  Plan: Same treatment. Needs to see the cardiologist, and I think he should be seen  this week. I will check on that..  Discussed with cardiologist Dr. Radford Pax who reviewed the EKG and the x-ray. She feels like the patient should be sent to the emergency room to be evaluated for possible pericardial effusion. The patient read the city bus here, so will need to call EMS to have the patient taken to the hospital.  Patient declined letting us call EMS. He says he is going to take the bus on over to the hospital. Since he is feeling better than he was 2 days ago,I do not feel like I can refer him to do otherwise. He promises he is going straight over there today.

## 2014-09-11 NOTE — ED Notes (Signed)
Pt sent here from [facility] with PNA and tachycardia. sts he has been having some cough and chest pain.

## 2014-09-11 NOTE — ED Provider Notes (Signed)
ED ECG REPORT   Date: 09/11/2014  Rate: 125  Rhythm: sinus tachycardia  QRS Axis: normal  Intervals: normal  ST/T Wave abnormalities: nonspecific T wave changes  Conduction Disutrbances:none  Narrative Interpretation:   Old EKG Reviewed: none available  I have personally reviewed the EKG tracing and agree with the computerized printout as noted.  Orlie Dakin, MD 09/11/14 [phone]

## 2014-09-11 NOTE — ED Notes (Signed)
CT notified pt ready for transport

## 2014-09-11 NOTE — H&P (Signed)
Date: 09/11/2014               Patient Name:  Clifford Murillo MRN: [id number]  DOB: [birth year] Age / Sex: 54 y.o., male   PCP: No Pcp Per Patient         Medical Service: Internal Medicine Teaching Service         Attending Physician: Dr. Madilyn Fireman, MD    First Contact: Reynaldo Minium Pager:   Second Contact: Dr. Natasha Bence Pager: [phone]       After Hours (After 5p/  First Contact Pager: [phone]  weekends / holidays): Second Contact Pager: [phone]   Chief Complaint: SOB, cough  History of Present Illness: [name] is a 54 year old man with no known past medical history who presented with SOB and cough. He originally went to urgent care 5/16  fora few days of SOB and non-productive cough. CXR at that time showed LLL infiltrate consistent with pneumonia, small L pleural effusion, and cardiomegaly. He was discharged with a z-pack and referred to cardiology. He returned to urgent care today saying he felt improved. He was still tachycardic into the 120s and had repeat CXR with unchanged cardiomegaly and slight improvement in the lung infiltrate. Urgent care provider spoke to cardiology who asked for patient to come to ED for evaluation of possible pericardial effusion.   On our interview, patient says he feels his SOB and cough have improved. He has ecchymosis and conjunctival hemorrhage of the R eye and says this is because he woke up on the floor yesterday and assumes he fell out of bed. He notes he has had palpitations for the past few days but otherwise denies any chest pain, abdominal pain, nausea, emesis, diarrhea, constipation, melena, hematachezia, dysuria, headache, lightheadedness, dizziness, vision change, eye pain, weakness, numbness, lower extremity swelling. No sick contacts. Only recent travel was to [place] a month ago. He denies alcohol, drugs, cigarettes. Only known family history is mother has DM2 but no known family cardiac history. He is s/p hernia repair  but otherwise has no known history.   In the ED, he had saturation down to 82% and was placed on 2 L nasal cannula. He had d-dimer of 20 so got chest CTA which demonstrated moderately large pericardial effusion, small R and moderate L pleural effusions, associated compressive atelectasis worse on L, and negative for PE.  Meds: No current facility-administered medications for this encounter.    Allergies: Allergies as of 09/11/2014  . (No Known Allergies)   History reviewed. No pertinent past medical history. Past Surgical History  Procedure Laterality Date  . Hernia repair     Family History  Problem Relation Age of Onset  . Diabetes Mother    History   Social History  . Marital Status: Single    Spouse Name: N/A  . Number of Children: N/A  . Years of Education: N/A   Occupational History  . Not on file.   Social History Main Topics  . Smoking status: Never Smoker   . Smokeless tobacco: Not on file  . Alcohol Use: No  . Drug Use: No  . Sexual Activity: Not on file   Other Topics Concern  . Not on file   Social History Narrative   Landscaping     Review of Systems: Review of systems negative except as noted above per HPI  Physical Exam: Blood pressure 140/83, pulse 121, temperature 100.2 F (37.9 C), temperature source Rectal, resp. rate 28, SpO2 95 %.  Gen: No acute distress, well developed, well nourished HEENT: R periorbital ecchymosis, R eye with conjunctival hemorrhage, PERRL, EOMI, mild sclerae icteris, moist mucous membranes Neck: No carotid bruits or JVD Heart: tachycardic but regular, possible substernal rub, normal S1 S2 Lungs: Faint bibasilar crackles, respirations unlabored Abd: Soft, non-tender, non-distended, + bowel sounds, no hepatosplenomegaly Ext: No edema or cyanosis Neuro: Flat affect, A&O x 4, CN II-XII intact, finger-nose-finger coordination intact, strength 5/5 and symmetric in all extremities, sensation grossly intact, no Babinkski  sign b/l  Lab results: Basic Metabolic Panel:  Recent Labs  09/09/14 1144 09/11/14 1458  NA 128* 131*  K 4.6 3.4*  CL 88* 91*  CO2 22 26  GLUCOSE 123* 110*  BUN 10 8  CREATININE 0.73 0.86  CALCIUM 8.8 8.6*   Liver Function Tests:  Recent Labs  09/09/14 1144  AST 47*  ALT 77*  ALKPHOS 99  BILITOT 4.8*  PROT 7.4  ALBUMIN 3.9   CBC:  Recent Labs  09/09/14 1152 09/11/14 1458  WBC 13.6* 14.9*  HGB 11.5* 12.7*  HCT 34.8* 37.1*  MCV 87.4 86.1  PLT  --  623*   D-Dimer:  Recent Labs  09/11/14 1458  DDIMER 19.97*   Thyroid Function Tests:  Recent Labs  09/09/14 1144  TSH 1.814  FREET4 1.23  T3FREE 2.7   Misc. Labs: BNP 129  Imaging results:  Dg Chest 2 View  09/11/2014   CLINICAL DATA:  Infiltrate.  EXAM: CHEST  2 VIEW  COMPARISON:  09/09/2014 .  FINDINGS: Mediastinum and hilar structures normal. Left base pulmonary infiltrate again noted. Slight interim clearing. Persistent small pleural effusions. Stable cardiomegaly. No pulmonary venous congestion. No acute bony abnormality .  IMPRESSION: 1. Interim slight clearing of left base infiltrate. Persistent small pleural effusions.  2.  Cardiomegaly.  Normal pulmonary vascularity .   Electronically Signed   By: Marcello Moores  Register   On: 09/11/2014 13:55   Ct Angio Chest Pe W/cm &/or Wo Cm  09/11/2014   CLINICAL DATA:  Shortness of breath for 2 days.  EXAM: CT ANGIOGRAPHY CHEST WITH CONTRAST  TECHNIQUE: Multidetector CT imaging of the chest was performed using the standard protocol during bolus administration of intravenous contrast. Multiplanar CT image reconstructions and MIPs were obtained to evaluate the vascular anatomy.  CONTRAST:  155mL OMNIPAQUE IOHEXOL 350 MG/ML SOLN  COMPARISON:  None.  FINDINGS: No pulmonary embolus is identified. The patient has a moderately large pericardial effusion. Small right and small to moderate left pleural effusions are also identified. Heart size is normal. No supraclavicular,  axillary, hilar or mediastinal lymphadenopathy is identified.  The lungs demonstrate only atelectatic change, more notable on the left.  Visualized upper abdomen is unremarkable. No focal bony abnormality is identified.  Review of the MIP images confirms the above findings.  IMPRESSION: Negative for pulmonary embolus.  Moderately large pericardial effusion. Small right and moderate left pleural effusions are also identified. Associated compressive atelectasis is worse on the left.   Electronically Signed   By: Inge Rise M.D.   On: 09/11/2014 20:56    Other results: EKG: NSR, electrical alternans (more pronounced than prior), no ST or t-wave changes suggesting ischemia, compared to prior 09/09/14  Assessment & Plan by Problem: Active Problems:   Pericardial effusion   #Pericardial effusion: Mr Hoagland's chest CTA reveals a moderately large pericardial effusion. His blood pressure is normotensive suggesting that he is not in tamponade. Possible substernal rub auscultated. EKG with electrical alternans. He is currently asymptomatic.  The cause of his effusion is unclear. Causes can include infectious (possible with LLL infiltrate and recent URI), idiopathic, autoimmune, neoplasm, myocarditis, aortic dissection (not on CTA), trauma (exam suggests trauma to R eye), infarction (EKG negative for signs of ischemia and trop negative), hypothyroidism (TSH 1.81 and free T4 1.23 two days ago), uremia (BUN 8). We have called cardiology for consult. -f/u cards  -TTE -troponin I -CK total and CKMB -UDS, ethanol -HIV -PT/INR -repeat EKG in am -telemetry -continuous pulse ox  #CAP: Today was day 3 of five for z-pack of LLL pneumonia. He does have leukocyosis with Tmax 100.2.  -continue azithromycin 250 mg daily x 2 more days -guaifenesin-dextromethorphan 5 mL po q4hprn  #Thrombocytosis: Platelet 623 in ED. Unclear cause -cont to monitor  #Elevated transaminases: AST and ALT 47 and 77, respectively,  two days ago with t bili 4.8. Unclear cause. While azithromycin can be hepatotoxic, he had these elevated transaminases before starting the antibiotic. -acute hepatitis panel -direct bilirubin -cont to monitor  #Normocytic Anemia: Hemoglobin 12.7 with MCV 86. He denies any melena or hematachezia. -FOBT -cont to monitor -recommend outpatient colonoscopy  #Hypokalemia: K 3.4. -KDUr 40 mEq po once -cont to monitor  #Hyponatremia: Na 131, improved from 128 two days ago. He is euvolemic. May be due to decreased po intake. -cont to monitor  #Diet: NPO  #DVT PPx: SCDs  #Code: Full  Dispo: Disposition is deferred at this time, awaiting improvement of current medical problems. Anticipated discharge in approximately 1-2 day(s).   The patient does not have a current PCP (No Pcp Per Patient) and does need an [facility] hospital follow-up appointment after discharge.  The patient does not know have transportation limitations that hinder transportation to clinic appointments.  Signed: Lottie Mussel, MD Internal Medicine, PGY-1 Pager [phone] 09/11/2014, 10:41 PM

## 2014-09-11 NOTE — ED Notes (Signed)
Per Freda Munro in lab, will add BNP

## 2014-09-12 ENCOUNTER — Inpatient Hospital Stay (HOSPITAL_COMMUNITY): Payer: 59

## 2014-09-12 ENCOUNTER — Other Ambulatory Visit: Payer: Self-pay

## 2014-09-12 DIAGNOSIS — R7989 Other specified abnormal findings of blood chemistry: Secondary | ICD-10-CM | POA: Diagnosis present

## 2014-09-12 DIAGNOSIS — R945 Abnormal results of liver function studies: Secondary | ICD-10-CM

## 2014-09-12 DIAGNOSIS — R0600 Dyspnea, unspecified: Secondary | ICD-10-CM

## 2014-09-12 DIAGNOSIS — I319 Disease of pericardium, unspecified: Secondary | ICD-10-CM

## 2014-09-12 LAB — BASIC METABOLIC PANEL
ANION GAP: 11 (ref 5–15)
BUN: 7 mg/dL (ref 6–20)
CALCIUM: 8 mg/dL — AB (ref 8.9–10.3)
CHLORIDE: 99 mmol/L — AB (ref 101–111)
CO2: 25 mmol/L (ref 22–32)
Creatinine, Ser: 0.81 mg/dL (ref 0.61–1.24)
GFR calc Af Amer: >60 mL/min (ref >60)
GFR calc non Af Amer: >60 mL/min (ref >60)
GLUCOSE: 112 mg/dL — AB (ref 65–99)
Potassium: 3.8 mmol/L (ref 3.5–5.1)
Sodium: 135 mmol/L (ref 135–145)

## 2014-09-12 LAB — HEPATIC FUNCTION PANEL
ALK PHOS: 136 U/L — AB (ref 38–126)
ALT: 96 U/L — ABNORMAL HIGH (ref 17–63)
AST: 86 U/L — ABNORMAL HIGH (ref 15–41)
Albumin: 2.7 g/dL — ABNORMAL LOW (ref 3.5–5.0)
Bilirubin, Direct: 3.2 mg/dL — ABNORMAL HIGH (ref 0.1–0.5)
Indirect Bilirubin: 1.8 mg/dL — ABNORMAL HIGH (ref 0.3–0.9)
Total Bilirubin: 5 mg/dL — ABNORMAL HIGH (ref 0.3–1.2)
Total Protein: 7.2 g/dL (ref 6.5–8.1)

## 2014-09-12 LAB — PROTIME-INR
INR: 1.24 (ref 0.00–1.49)
Prothrombin Time: 15.8 seconds — ABNORMAL HIGH (ref 11.6–15.2)

## 2014-09-12 LAB — SEDIMENTATION RATE: Sed Rate: 84 mm/hr — ABNORMAL HIGH (ref 0–16)

## 2014-09-12 LAB — HIV ANTIBODY (ROUTINE TESTING W REFLEX): HIV Screen 4th Generation wRfx: NONREACTIVE

## 2014-09-12 LAB — HEPATITIS PANEL, ACUTE
HCV Ab: NEGATIVE
HEP A IGM: NONREACTIVE
Hep B C IgM: NONREACTIVE
Hepatitis B Surface Ag: NEGATIVE

## 2014-09-12 LAB — MRSA PCR SCREENING: MRSA by PCR: NEGATIVE

## 2014-09-12 LAB — TROPONIN I

## 2014-09-12 LAB — C-REACTIVE PROTEIN: CRP: 38.5 mg/dL — AB (ref <1.0)

## 2014-09-12 MED ORDER — ASPIRIN 325 MG PO TABS
650.0000 mg | ORAL_TABLET | Freq: Three times a day (TID) | ORAL | Status: DC
  Administered 2014-09-12 – 2014-09-14 (×7): 650 mg via ORAL
  Filled 2014-09-12 (×8): qty 2

## 2014-09-12 MED ORDER — COLCHICINE 0.6 MG PO TABS
0.6000 mg | ORAL_TABLET | Freq: Two times a day (BID) | ORAL | Status: DC
  Administered 2014-09-12 – 2014-09-14 (×6): 0.6 mg via ORAL
  Filled 2014-09-12 (×6): qty 1

## 2014-09-12 NOTE — Consult Note (Signed)
CARDIOLOGY CONSULT NOTE  Patient ID: Clifford Murillo MRN: [id number] DOB/AGE: 1960/12/11 54 y.o.  Admit date: 09/11/2014 Primary Cardiologist: New Reason for Consultation: Pericardial effusion  HPI: 54 yo without significant past history was admitted today with pericardial effusion.  On 5/15, patient woke up on the floor, he is not sure what happened.  He had ecchymosis around the right eye.  That day, he began to feel poorly with cough and subjective fever.  .Patient went to urgent care on 5/16 with cough and fever.  He had a CXR showing cardiomegaly and LLL PNA.  He was started on azithromycin and told to come back in 2 days.  He came back to urgent care today and was tachycardic in the 120s (sinus tachy).  There was concern that the cardiomegaly could be due to a pericardial effusion.  He was still coughing.  He was sent to the ER.  In the ER, oxygen saturation was 82% on room air, improved to 96% on 2L [place]. CTA chest was done, showing no PE but bilateral pleural effusion, left basilar atelectasis, and moderate to large pericardial effusion.  He denies chest pain.  No dyspnea at rest but has had exertional dyspnea over the last couple days.  No lightheadedness or syncope.  He has a low grade fever on exam (100.2).    Review of systems complete and found to be negative unless listed above in HPI  Past Medical History: None.   Family History  Problem Relation Age of Onset  . Diabetes Mother     History   Social History  . Marital Status: Single    Spouse Name: N/A  . Number of Children: N/A  . Years of Education: N/A   Occupational History  . Not on file.   Social History Main Topics  . Smoking status: Never Smoker   . Smokeless tobacco: Never Used  . Alcohol Use: No  . Drug Use: No  . Sexual Activity: Yes   Other Topics Concern  . Not on file   Social History Narrative   Landscaping      Prescriptions prior to admission  Medication Sig Dispense Refill Last Dose  .  azithromycin (ZITHROMAX Z-PAK) 250 MG tablet 2 today, then one daily 6 each 0 09/11/2014 at Unknown time  . HYDROcodone-homatropine (HYCODAN) 5-1.5 MG/5ML syrup Take 5 mLs by mouth every 8 (eight) hours as needed for cough. 120 mL 0 09/09/2014    Physical exam Blood pressure 140/83, pulse 121, temperature 98.7 F (37.1 C), temperature source Oral, resp. rate 28, height '5\' 3"'  (1.6 m), weight 139 lb 9.6 oz (63.322 kg), SpO2 95 %. General: NAD Neck: JVP 10 cm with Kussmaul's sign, no thyromegaly or thyroid nodule.  Lungs: Decreased breath sounds at bases bilaterally.  CV: Nondisplaced PMI.  Heart tachy, regular S1/S2, no S3/S4.  There is a prominent friction rub.  No peripheral edema.  No carotid bruit.  Normal pedal pulses.  Abdomen: Soft, nontender, no hepatosplenomegaly, no distention.  Skin: Intact without lesions or rashes.  Neurologic: Alert and oriented x 3.  Psych: Normal affect. Extremities: No clubbing or cyanosis.  HEENT: Normal.   Labs:   Lab Results  Component Value Date   WBC 14.9* 09/11/2014   HGB 12.7* 09/11/2014   HCT 37.1* 09/11/2014   MCV 86.1 09/11/2014   PLT 623* 09/11/2014    Recent Labs Lab 09/09/14 1144 09/11/14 1458 09/11/14 2201  NA 128* 131*  --   K 4.6 3.4*  --  CL 88* 91*  --   CO2 22 26  --   BUN 10 8  --   CREATININE 0.73 0.86  --   CALCIUM 8.8 8.6*  --   PROT 7.4  --   --   BILITOT 4.8*  --  6.0*  ALKPHOS 99  --   --   ALT 77*  --   --   AST 47*  --   --   GLUCOSE 123* 110*  --    Lab Results  Component Value Date   CKTOTAL 333 09/11/2014   CKMB 3.1 09/11/2014   TROPONINI <0.03 09/11/2014    Radiology:  - CTA chest: No PE.  Moderately large pericardial effusion.  Small right pleural effusion, moderate left pleural effusion. Atelectasis on left.   EKG: sinus tachycardia with biatrial enlargement, anteroseptal Qs, PACs  ASSESSMENT AND PLAN: 54 yo with no significant PMH was admitted with moderate to large pericardial effusion, cough,  and low grade fever.  1. Pericardial effusion: Presentation with low grade fever and cough suggests a possible infectious process => possible viral pericarditis with development of effusion.  Interestingly, he has not had significant chest pain.  He does have a very prominent pericardial friction rub on exam but ECG is not suggestive of pericarditis.  He has JVD with Kussmaul's sign and tachycardia, but blood pressure is stable currently and he is asymptomatic at rest.  Elevated D dimer likely represents inflammation.  - I do not think that he urgently needs pericardiocentesis as he is currently stable.  Will get echocardiogram => if clear tamponade on echo will need pericardiocentesis.  May consider pericardiocentesis regardless for diagnostic purposes (send fluid for cytology to r/o malignancy).  Keep NPO in case tap is needed.  - Send HIV, ESR, CRP, ANA.  - Treat with colchicine and high dose ASA for inflammation. - Avoid heparin prophylaxis.  2. Elevated bilirubin: May be due to hepatic congestion in setting of hemodynamically significant pericardial effusion.  Should send full set of LFTs.   Loralie Champagne 09/12/2014

## 2014-09-12 NOTE — Progress Notes (Signed)
Subjective: Pt denies sob, chest pain.  He is still having a cough. He has eye hemorrhage. He doesn't have anything to complain of today  Objective: Vital signs in last 24 hours: Filed Vitals:   09/12/14 0400 09/12/14 0500 09/12/14 0645 09/12/14 0756  BP: 123/71  103/71   Pulse: 115  116   Temp: 98.3 F (36.8 C)   98.1 F (36.7 C)  TempSrc: Oral   Oral  Resp: '31 28 22   ' Height:      Weight: 139 lb 9.6 oz (63.322 kg)     SpO2: 98% 96% 100%    Weight change:   Intake/Output Summary (Last 24 hours) at 09/12/14 0916 Last data filed at 09/12/14 0600  Gross per 24 hour  Intake      0 ml  Output    650 ml  Net   -650 ml   Vitals reviewed. General: resting in bed, NAD, friend at bedside  HEENT:[place]/at no scleral icterus Cardiac: ST, +subtle friction rub, no murmurs or gallops, no JVD Pulm: decreased lung sounds bases b/l  Abd: soft, nontender, nondistended, BS present Ext: warm and well perfused, trace pedal edema Neuro: alert and oriented X3, cranial nerves II-XII grossly intact, moving all 4 extremities   Lab Results: Basic Metabolic Panel:  Recent Labs Lab 09/11/14 1458 09/11/14 2201 09/12/14 0355  NA 131*  --  135  K 3.4*  --  3.8  CL 91*  --  99*  CO2 26  --  25  GLUCOSE 110*  --  112*  BUN 8  --  7  CREATININE 0.86  --  0.81  CALCIUM 8.6*  --  8.0*  MG  --  2.7*  --    Liver Function Tests:  Recent Labs Lab 09/09/14 1144 09/11/14 2201 09/12/14 0355  AST 47*  --  86*  ALT 77*  --  96*  ALKPHOS 99  --  136*  BILITOT 4.8* 6.0* 5.0*  PROT 7.4  --  7.2  ALBUMIN 3.9  --  2.7*   CBC:  Recent Labs Lab 09/09/14 1152 09/11/14 1458  WBC 13.6* 14.9*  HGB 11.5* 12.7*  HCT 34.8* 37.1*  MCV 87.4 86.1  PLT  --  623*   Cardiac Enzymes:  Recent Labs Lab 09/11/14 2201 09/12/14 0355  CKTOTAL 333  --   CKMB 3.1  --   TROPONINI <0.03 <0.03   D-Dimer:  Recent Labs Lab 09/11/14 1458  DDIMER 19.97*   Thyroid Function Tests:  Recent Labs Lab  09/09/14 1144  TSH 1.814  FREET4 1.23  T3FREE 2.7   Coagulation:  Recent Labs Lab 09/11/14 2201 09/12/14 0355  LABPROT 15.4* 15.8*  INR 1.20 1.24   Urine Drug Screen: Drugs of Abuse     Component Value Date/Time   LABOPIA NONE DETECTED 09/11/2014 2218   COCAINSCRNUR NONE DETECTED 09/11/2014 2218   LABBENZ NONE DETECTED 09/11/2014 2218   AMPHETMU NONE DETECTED 09/11/2014 2218   THCU NONE DETECTED 09/11/2014 2218   LABBARB NONE DETECTED 09/11/2014 2218    Alcohol Level:  Recent Labs Lab 09/11/14 2211  ETH <5    Misc. Labs: ANA HIV ESR CRP  Micro Results: Recent Results (from the past 240 hour(s))  MRSA PCR Screening     Status: None   Collection Time: 09/11/14 10:40 PM  Result Value Ref Range Status   MRSA by PCR NEGATIVE NEGATIVE Final    Comment:        The GeneXpert MRSA  Assay (FDA approved for NASAL specimens only), is one component of a comprehensive MRSA colonization surveillance program. It is not intended to diagnose MRSA infection nor to guide or monitor treatment for MRSA infections.    Studies/Results: Dg Chest 2 View  09/11/2014   CLINICAL DATA:  Infiltrate.  EXAM: CHEST  2 VIEW  COMPARISON:  09/09/2014 .  FINDINGS: Mediastinum and hilar structures normal. Left base pulmonary infiltrate again noted. Slight interim clearing. Persistent small pleural effusions. Stable cardiomegaly. No pulmonary venous congestion. No acute bony abnormality .  IMPRESSION: 1. Interim slight clearing of left base infiltrate. Persistent small pleural effusions.  2.  Cardiomegaly.  Normal pulmonary vascularity .   Electronically Signed   By: Marcello Moores  Register   On: 09/11/2014 13:55   Ct Angio Chest Pe W/cm &/or Wo Cm  09/11/2014   CLINICAL DATA:  Shortness of breath for 2 days.  EXAM: CT ANGIOGRAPHY CHEST WITH CONTRAST  TECHNIQUE: Multidetector CT imaging of the chest was performed using the standard protocol during bolus administration of intravenous contrast.  Multiplanar CT image reconstructions and MIPs were obtained to evaluate the vascular anatomy.  CONTRAST:  161m OMNIPAQUE IOHEXOL 350 MG/ML SOLN  COMPARISON:  None.  FINDINGS: No pulmonary embolus is identified. The patient has a moderately large pericardial effusion. Small right and small to moderate left pleural effusions are also identified. Heart size is normal. No supraclavicular, axillary, hilar or mediastinal lymphadenopathy is identified.  The lungs demonstrate only atelectatic change, more notable on the left.  Visualized upper abdomen is unremarkable. No focal bony abnormality is identified.  Review of the MIP images confirms the above findings.  IMPRESSION: Negative for pulmonary embolus.  Moderately large pericardial effusion. Small right and moderate left pleural effusions are also identified. Associated compressive atelectasis is worse on the left.   Electronically Signed   By: TInge RiseM.D.   On: 09/11/2014 20:56   Medications: Scheduled Meds: . aspirin  650 mg Oral 3 times per day  . azithromycin  250 mg Oral Daily  . colchicine  0.6 mg Oral BID  . sodium chloride  3 mL Intravenous Q12H   Continuous Infusions:  PRN Meds:.guaiFENesin-dextromethorphan Assessment/Plan: 54y.o pmh CAP, pericardial effusion noted 5/18.  He presented with cough and sob found to have pericardial effusion, sinus tachycardia.   #Moderate-Large Pericardial effusion on CT -Pt appears asymptomatic  -cardiology following appreciate recs.  Prev. Cards mentioned pericardiocentesis for fluid for cytology to r/o malignancy.  Unclear etiology  -Elevated CRP 38.5, pending HIV, acute hepatitis panel, ANA, ESR, Trop neg x 2 pending 1 more trop -continue high dose Aspirin 650 mg tid , Colchicine 0.6 bid  -pending echo read   #Sinus tachycardia -improving monitor via tele   #Dyspnea -improved try to titrate O2 from 3.5 L   #Thrombocytosis -likely reactive   #Elevated LFTs/bilirubin -UDS, ethanol  negative.  -pending hepatitis panel, CMET am, fractionated bili in am  #Recent treatment for CAP -continue AZM x 2 days already had outpatient treatment prior to admission -prn cough  #Periorbital ecchymosis of right eye with subconjunctival hemorrhage of right eye and nose bleed -supportive treatment, watch  -nose bleeding resolved   #F/E/N   -none -Hypokalemia resolved, hyponatremia resolved. Electrolytes in am  -NPO for now   #DVT px  -scds      Dispo: Disposition is deferred at this time, awaiting improvement of current medical problems.  Anticipated discharge in approximately 2 day(s).   The patient does not have a current  PCP (No Pcp Per Patient) and does not need an [facility] hospital follow-up appointment after discharge.  He should establish with Family medicine at discharge.     The patient does not have transportation limitations that hinder transportation to clinic appointments.  .Services Needed at time of discharge: Y = Yes, Blank = No PT:   OT:   RN:   Equipment:   Other:     LOS: 1 day   Cresenciano Genre, MD 09/12/2014, 9:16 AM

## 2014-09-12 NOTE — Care Management Note (Signed)
Case Management Note  Patient Details  Name: Ziv Welchel MRN: [id number] Date of Birth: [birth year]  Subjective/Objective:   Pt was seen at urgent care 5/18 for CAP, cardiometaly seen on CXR. ED CT confirms moderate to large pericardial effusion.               Action/Plan: CM to monitor for disposition needs.   Expected Discharge Date:                  Expected Discharge Plan:  Home/Self Care  In-House Referral:     Discharge planning Services  CM Consult  Post Acute Care Choice:    Choice offered to:     DME Arranged:    DME Agency:     HH Arranged:    [place] Agency:     Status of Service:     Medicare Important Message Given:    Date Medicare IM Given:    Medicare IM give by:    Date Additional Medicare IM Given:    Additional Medicare Important Message give by:     If discussed at [place] of Stay Meetings, dates discussed:    Additional Comments:  Bethena Roys, RN 09/12/2014, 3:53 PM

## 2014-09-12 NOTE — Progress Notes (Signed)
  Echocardiogram 2D Echocardiogram has been performed.  Clifford Murillo 09/12/2014, 11:01 AM

## 2014-09-12 NOTE — Progress Notes (Signed)
Inpatient Progress Note - Internal Medicine  Subjective: This morning the patient is feeling well. He denies shortness of breath, but does endorse a nonproductive cough. He denies chest pain or abdominal pain. He is concerned about the fluid around his heart; he has no other complaints.  Objective: Vital signs in last 24 hours: Filed Vitals:   09/12/14 0400 09/12/14 0500 09/12/14 0645 09/12/14 0756  BP: 123/71  103/71   Pulse: 115  116   Temp: 98.3 F (36.8 C)   98.1 F (36.7 C)  TempSrc: Oral   Oral  Resp: '31 28 22   ' Height:      Weight: 63.322 kg (139 lb 9.6 oz)     SpO2: 98% 96% 100%    Weight change:   Intake/Output Summary (Last 24 hours) at 09/12/14 1031 Last data filed at 09/12/14 1000  Gross per 24 hour  Intake      3 ml  Output    650 ml  Net   -647 ml   General: Calm, conversant, well-appearing man sitting comfortably in bed. HEENT:  PERRL, EOMI, sclerae icteric, mmm no oropharyngeal erythema Cardiac: Tachycardic, regular rhythm. Soft rub appreciated with pt sitting up and leaning forward. No JVD or hepatojugular reflux. Pulmonary:  Decreased breath sounds over bilateral bases, L>R. Coughing on deep inspiration. Normal WOB Abdominal: soft, nt/nd, no hepatosplenomegaly Extremities: No pedal edema. Neurologic:  Calm, affect restricted, speech content appropriate. Moves all limbs spontaneously.  Lab Results: Basic Metabolic Panel:  Recent Labs Lab 09/11/14 1458 09/11/14 2201 09/12/14 0355  NA 131*  --  135  K 3.4*  --  3.8  CL 91*  --  99*  CO2 26  --  25  GLUCOSE 110*  --  112*  BUN 8  --  7  CREATININE 0.86  --  0.81  CALCIUM 8.6*  --  8.0*  MG  --  2.7*  --    Liver Function Tests:  Recent Labs Lab 09/09/14 1144 09/11/14 2201 09/12/14 0355  AST 47*  --  86*  ALT 77*  --  96*  ALKPHOS 99  --  136*  BILITOT 4.8* 6.0* 5.0*  PROT 7.4  --  7.2  ALBUMIN 3.9  --  2.7*   CBC:  Recent Labs Lab 09/09/14 1152 09/11/14 1458  WBC 13.6* 14.9*   HGB 11.5* 12.7*  HCT 34.8* 37.1*  MCV 87.4 86.1  PLT  --  623*   Cardiac Enzymes:  Recent Labs Lab 09/11/14 2201 09/12/14 0355  CKTOTAL 333  --   CKMB 3.1  --   TROPONINI <0.03 <0.03   D-Dimer:  Recent Labs Lab 09/11/14 1458  DDIMER 19.97*   Thyroid Function Tests:  Recent Labs Lab 09/09/14 1144  TSH 1.814  FREET4 1.23  T3FREE 2.7   Coagulation:  Recent Labs Lab 09/11/14 2201 09/12/14 0355  LABPROT 15.4* 15.8*  INR 1.20 1.24   Urine Drug Screen: Drugs of Abuse     Component Value Date/Time   LABOPIA NONE DETECTED 09/11/2014 2218   COCAINSCRNUR NONE DETECTED 09/11/2014 2218   LABBENZ NONE DETECTED 09/11/2014 2218   AMPHETMU NONE DETECTED 09/11/2014 2218   THCU NONE DETECTED 09/11/2014 2218   LABBARB NONE DETECTED 09/11/2014 2218    Alcohol Level:  Recent Labs Lab 09/11/14 2211  ETH <5   Micro Results: Recent Results (from the past 240 hour(s))  MRSA PCR Screening     Status: None   Collection Time: 09/11/14 10:40 PM  Result Value  Ref Range Status   MRSA by PCR NEGATIVE NEGATIVE Final    Comment:        The GeneXpert MRSA Assay (FDA approved for NASAL specimens only), is one component of a comprehensive MRSA colonization surveillance program. It is not intended to diagnose MRSA infection nor to guide or monitor treatment for MRSA infections.    Studies/Results: Dg Chest 2 View  09/11/2014   CLINICAL DATA:  Infiltrate.  EXAM: CHEST  2 VIEW  COMPARISON:  09/09/2014 .  FINDINGS: Mediastinum and hilar structures normal. Left base pulmonary infiltrate again noted. Slight interim clearing. Persistent small pleural effusions. Stable cardiomegaly. No pulmonary venous congestion. No acute bony abnormality .  IMPRESSION: 1. Interim slight clearing of left base infiltrate. Persistent small pleural effusions.  2.  Cardiomegaly.  Normal pulmonary vascularity .   Electronically Signed   By: Marcello Moores  Register   On: 09/11/2014 13:55   Ct Angio Chest Pe  W/cm &/or Wo Cm  09/11/2014   CLINICAL DATA:  Shortness of breath for 2 days.  EXAM: CT ANGIOGRAPHY CHEST WITH CONTRAST  TECHNIQUE: Multidetector CT imaging of the chest was performed using the standard protocol during bolus administration of intravenous contrast. Multiplanar CT image reconstructions and MIPs were obtained to evaluate the vascular anatomy.  CONTRAST:  161m OMNIPAQUE IOHEXOL 350 MG/ML SOLN  COMPARISON:  None.  FINDINGS: No pulmonary embolus is identified. The patient has a moderately large pericardial effusion. Small right and small to moderate left pleural effusions are also identified. Heart size is normal. No supraclavicular, axillary, hilar or mediastinal lymphadenopathy is identified.  The lungs demonstrate only atelectatic change, more notable on the left.  Visualized upper abdomen is unremarkable. No focal bony abnormality is identified.  Review of the MIP images confirms the above findings.  IMPRESSION: Negative for pulmonary embolus.  Moderately large pericardial effusion. Small right and moderate left pleural effusions are also identified. Associated compressive atelectasis is worse on the left.   Electronically Signed   By: TInge RiseM.D.   On: 09/11/2014 20:56   Medications: I have reviewed the patient's current medications. Scheduled Meds: . aspirin  650 mg Oral 3 times per day  . azithromycin  250 mg Oral Daily  . colchicine  0.6 mg Oral BID  . sodium chloride  3 mL Intravenous Q12H   Continuous Infusions:  PRN Meds:.guaiFENesin-dextromethorphan Assessment/Plan: Principal Problem:   Pericardial effusion Active Problems:   Periorbital ecchymosis of right eye   Subconjunctival hemorrhage of right eye   Hypokalemia   Thrombocytosis   Hyponatremia   Elevated LFTs 54y.o. M no significant PMH p/w e/o pericardial effusion found incidentally in w/u for CAP.  Pericardial Effusion: Pt was seen at urgent care 5/18 for CAP, cardiometaly seen on CXR. ED CT confirms  moderate to large pericardial effusion. No previous films for comparison, but presentation after cough and presumed PNA suggests infectious process. Trop I neg x2, CK/CK-MB WNL. No hypothyroidism as TSH, FT4, FT3 all WNL. PSA elevated at 3.77 but CT w/o e/o intrapulmonary or cardiac malignancy. ECG no e/o pericarditis. - Cardiology consulted, appreciate recs - Per cardiology, no need for urgent pericardiocentesis as pt is stable. May still need pericardiocentesis: diagnostic or if echo shows tamponade then therapeutic. - TTE performed 5/19 AM, pending read - CRP elevated at 38.5. HIV, ESR, ANA pending. - Per cardiology, colchicine 0.6 mg BID and ASA 650 mg TID for inflammation - FOBT pending to screen for colorectal malignancy - telemetry  CAP: LLL PNA,  seen in urgent care 5/16 and started on Z-pak. Persistent LLL infiltrate and L > R pleural effusion on CXR and CT 5/18. Tmax 100.2 since admission. No SOB at rest, but + DOE. SpO2 82% on RA in ED, 96% on 2L [place]. D-Dimer 19.97 but CTA negative for PE.  - Continue Azithromycin (last day = 5/20) - Guaifenesin-Dextromethorphan q4hr prn for cough - O2 via [place] for SpO2 > 92%  Transaminitis: AST/ALT 21/23 on 9/21, 47/77 on 5/16 in Urgent Care. Continuing to trend upward to 86/96 today 5/19 AM. Patient with mild scleral icterus, no abdominal pain, no change in BM color/consistency/frequency. May represent hepatic congestion 2/2 pericardial effusion. Total and direct bili elevated at 3.2, 1.8 (5.0 total bili). Synthetic function likely normal: INR 1.24, Tprot 7.2, Albumin 2.7. UDS negative, EtOH negative. - Continue Azithromycin, caution with other hepatotoxic meds - HIV, viral hepatitis panel pending. - Trend LFTs - Consider liver U/S for worsening or persistent transaminitis  Hyponatremia: Na 128 in Urgent Care. Trending upward, now WNL at 135 on 5/19 AM. Previously WNL on 01/14/2014. Possibly 2/2 increased free water intake. - Encourage PO  H/O R Facial  Trauma: R eye with conjunctival ecchymosis, subconjunctival hemorrhage. Pt reports waking up on the floor, possibly fell out of bed. H/o epistaxis, now resolved. - CTM  F: None E: Hypokalemia resolved with KCl 40 mEq IV on 5/18. Hyponatremia plan as above. CTM. N: NPO for possible pericardiocentesis, pending cardiology recs  PPX: SCDs - Hold heparin in pericardial effusion  Dispo: Disposition is deferred at this time, awaiting improvement of current medical problems.  Anticipated discharge in approximately 2-3 day(s).   The patient does not have a current PCP (No Pcp Per Patient) and does need an [facility] hospital follow-up appointment after discharge.  The patient does not have transportation limitations that hinder transportation to clinic appointments.  .Services Needed at time of discharge: Y = Yes, Blank = No PT:   OT:   RN:   Equipment:   Other:     LOS: 1 day   Susa Day, Med Student 09/12/2014, 10:31 AM

## 2014-09-12 NOTE — Progress Notes (Signed)
UR Completed Jax Kentner Graves-Bigelow, RN,BSN [phone]  

## 2014-09-12 NOTE — Progress Notes (Signed)
Pt's 02 stats dropped down to 88% on ra while sleeping. Pt placed on 3L of 02 and 02 went up to 97%. Will cont to monitor pt.

## 2014-09-13 ENCOUNTER — Other Ambulatory Visit: Payer: Self-pay

## 2014-09-13 DIAGNOSIS — R7989 Other specified abnormal findings of blood chemistry: Secondary | ICD-10-CM

## 2014-09-13 DIAGNOSIS — I309 Acute pericarditis, unspecified: Secondary | ICD-10-CM

## 2014-09-13 DIAGNOSIS — H1130 Conjunctival hemorrhage, unspecified eye: Secondary | ICD-10-CM

## 2014-09-13 LAB — HEPATIC FUNCTION PANEL
ALK PHOS: 115 U/L (ref 38–126)
ALT: 76 U/L — AB (ref 17–63)
AST: 48 U/L — AB (ref 15–41)
Albumin: 2.4 g/dL — ABNORMAL LOW (ref 3.5–5.0)
BILIRUBIN DIRECT: 1.8 mg/dL — AB (ref 0.1–0.5)
BILIRUBIN TOTAL: 3.2 mg/dL — AB (ref 0.3–1.2)
Indirect Bilirubin: 1.4 mg/dL — ABNORMAL HIGH (ref 0.3–0.9)
Total Protein: 6.7 g/dL (ref 6.5–8.1)

## 2014-09-13 LAB — CBC WITH DIFFERENTIAL/PLATELET
BASOS PCT: 0 % (ref 0–1)
Basophils Absolute: 0 10*3/uL (ref 0.0–0.1)
EOS ABS: 0.2 10*3/uL (ref 0.0–0.7)
EOS PCT: 2 % (ref 0–5)
HEMATOCRIT: 36.4 % — AB (ref 39.0–52.0)
HEMOGLOBIN: 12.1 g/dL — AB (ref 13.0–17.0)
Lymphocytes Relative: 13 % (ref 12–46)
Lymphs Abs: 1.3 10*3/uL (ref 0.7–4.0)
MCH: 28.9 pg (ref 26.0–34.0)
MCHC: 33.2 g/dL (ref 30.0–36.0)
MCV: 86.9 fL (ref 78.0–100.0)
MONOS PCT: 13 % — AB (ref 3–12)
Monocytes Absolute: 1.4 10*3/uL — ABNORMAL HIGH (ref 0.1–1.0)
Neutro Abs: 7.4 10*3/uL (ref 1.7–7.7)
Neutrophils Relative %: 72 % (ref 43–77)
Platelets: 594 10*3/uL — ABNORMAL HIGH (ref 150–400)
RBC: 4.19 MIL/uL — ABNORMAL LOW (ref 4.22–5.81)
RDW: 13 % (ref 11.5–15.5)
WBC: 9.8 10*3/uL (ref 4.0–10.5)

## 2014-09-13 LAB — BASIC METABOLIC PANEL
Anion gap: 12 (ref 5–15)
BUN: 9 mg/dL (ref 6–20)
CO2: 24 mmol/L (ref 22–32)
Calcium: 8.2 mg/dL — ABNORMAL LOW (ref 8.9–10.3)
Chloride: 97 mmol/L — ABNORMAL LOW (ref 101–111)
Creatinine, Ser: 0.73 mg/dL (ref 0.61–1.24)
GFR calc Af Amer: >60 mL/min (ref >60)
GFR calc non Af Amer: >60 mL/min (ref >60)
GLUCOSE: 84 mg/dL (ref 65–99)
POTASSIUM: 3.6 mmol/L (ref 3.5–5.1)
SODIUM: 133 mmol/L — AB (ref 135–145)

## 2014-09-13 LAB — MAGNESIUM: Magnesium: 2.4 mg/dL (ref 1.7–2.4)

## 2014-09-13 MED ORDER — PANTOPRAZOLE SODIUM 40 MG PO TBEC
40.0000 mg | DELAYED_RELEASE_TABLET | Freq: Every day | ORAL | Status: DC
  Administered 2014-09-14: 40 mg via ORAL
  Filled 2014-09-13: qty 1

## 2014-09-13 MED ORDER — IBUPROFEN 200 MG PO TABS
400.0000 mg | ORAL_TABLET | Freq: Three times a day (TID) | ORAL | Status: DC
  Administered 2014-09-13 – 2014-09-14 (×3): 400 mg via ORAL
  Filled 2014-09-13 (×3): qty 2

## 2014-09-13 NOTE — Progress Notes (Signed)
Subjective: No complaints today. Denies chest pain, SOB, palpitations, dizziness, or lightheadedness.   Objective: Vital signs in last 24 hours: Filed Vitals:   09/12/14 2045 09/12/14 2349 09/13/14 0500 09/13/14 0830  BP: 121/75 112/45 117/48 119/85  Pulse: 110 112 111 105  Temp: 98.5 F (36.9 C) 97.8 F (36.6 C) 97.5 F (36.4 C) 97.7 F (36.5 C)  TempSrc: Axillary Oral Oral Oral  Resp:  18  18  Height:   '5\' 2"'  (1.575 m)   Weight:   138 lb 4.8 oz (62.732 kg)   SpO2: 97% 94% 99% 100%   Weight change: -1 lb 4.8 oz (-0.59 kg)  Intake/Output Summary (Last 24 hours) at 09/13/14 1059 Last data filed at 09/12/14 1700  Gross per 24 hour  Intake    260 ml  Output      0 ml  Net    260 ml   Physical Exam: General: AA male, alert, cooperative, NAD. HEENT: PERRL, EOMI. Moist mucus membranes. Right lateral/periorbital abrasion, conjunctival hemorrhage.  Neck: Full range of motion without pain, supple, no lymphadenopathy or carotid bruits. JVD on exam.  Lungs: Clear to ascultation bilaterally, normal work of respiration, no wheezes, rales, rhonchi Heart: RRR, no murmurs or gallops. POSSIBLE faint friction rub while sitting up.  Abdomen: Soft, non-tender, non-distended, BS + Extremities: No cyanosis, clubbing, or edema Neurologic: Alert & oriented x3, cranial nerves II-XII intact, strength grossly intact, sensation intact to light touch    Lab Results: Basic Metabolic Panel:  Recent Labs Lab 09/11/14 2201 09/12/14 0355 09/13/14 0300  NA  --  135 133*  K  --  3.8 3.6  CL  --  99* 97*  CO2  --  25 24  GLUCOSE  --  112* 84  BUN  --  7 9  CREATININE  --  0.81 0.73  CALCIUM  --  8.0* 8.2*  MG 2.7*  --  2.4   Liver Function Tests:  Recent Labs Lab 09/12/14 0355 09/13/14 0300  AST 86* 48*  ALT 96* 76*  ALKPHOS 136* 115  BILITOT 5.0* 3.2*  PROT 7.2 6.7  ALBUMIN 2.7* 2.4*   CBC:  Recent Labs Lab 09/11/14 1458 09/13/14 0300  WBC 14.9* 9.8  NEUTROABS  --  7.4    HGB 12.7* 12.1*  HCT 37.1* 36.4*  MCV 86.1 86.9  PLT 623* 594*   Cardiac Enzymes:  Recent Labs Lab 09/11/14 2201 09/12/14 0355 09/12/14 1225  CKTOTAL 333  --   --   CKMB 3.1  --   --   TROPONINI <0.03 <0.03 <0.03   D-Dimer:  Recent Labs Lab 09/11/14 1458  DDIMER 19.97*   Thyroid Function Tests:  Recent Labs Lab 09/09/14 1144  TSH 1.814  FREET4 1.23  T3FREE 2.7   Coagulation:  Recent Labs Lab 09/11/14 2201 09/12/14 0355  LABPROT 15.4* 15.8*  INR 1.20 1.24   Urine Drug Screen: Drugs of Abuse     Component Value Date/Time   LABOPIA NONE DETECTED 09/11/2014 2218   COCAINSCRNUR NONE DETECTED 09/11/2014 2218   LABBENZ NONE DETECTED 09/11/2014 2218   AMPHETMU NONE DETECTED 09/11/2014 2218   THCU NONE DETECTED 09/11/2014 2218   LABBARB NONE DETECTED 09/11/2014 2218    Alcohol Level:  Recent Labs Lab 09/11/14 [place] <5    Misc. Labs:  ANA pending HIV negative ESR 84 CRP 38.5  Micro Results: Recent Results (from the past 240 hour(s))  MRSA PCR Screening     Status: None  Collection Time: 09/11/14 10:40 PM  Result Value Ref Range Status   MRSA by PCR NEGATIVE NEGATIVE Final    Comment:        The GeneXpert MRSA Assay (FDA approved for NASAL specimens only), is one component of a comprehensive MRSA colonization surveillance program. It is not intended to diagnose MRSA infection nor to guide or monitor treatment for MRSA infections.    Studies/Results: Dg Chest 2 View  09/11/2014   CLINICAL DATA:  Infiltrate.  EXAM: CHEST  2 VIEW  COMPARISON:  09/09/2014 .  FINDINGS: Mediastinum and hilar structures normal. Left base pulmonary infiltrate again noted. Slight interim clearing. Persistent small pleural effusions. Stable cardiomegaly. No pulmonary venous congestion. No acute bony abnormality .  IMPRESSION: 1. Interim slight clearing of left base infiltrate. Persistent small pleural effusions.  2.  Cardiomegaly.  Normal pulmonary vascularity  .   Electronically Signed   By: Marcello Moores  Register   On: 09/11/2014 13:55   Ct Angio Chest Pe W/cm &/or Wo Cm  09/11/2014   CLINICAL DATA:  Shortness of breath for 2 days.  EXAM: CT ANGIOGRAPHY CHEST WITH CONTRAST  TECHNIQUE: Multidetector CT imaging of the chest was performed using the standard protocol during bolus administration of intravenous contrast. Multiplanar CT image reconstructions and MIPs were obtained to evaluate the vascular anatomy.  CONTRAST:  172m OMNIPAQUE IOHEXOL 350 MG/ML SOLN  COMPARISON:  None.  FINDINGS: No pulmonary embolus is identified. The patient has a moderately large pericardial effusion. Small right and small to moderate left pleural effusions are also identified. Heart size is normal. No supraclavicular, axillary, hilar or mediastinal lymphadenopathy is identified.  The lungs demonstrate only atelectatic change, more notable on the left.  Visualized upper abdomen is unremarkable. No focal bony abnormality is identified.  Review of the MIP images confirms the above findings.  IMPRESSION: Negative for pulmonary embolus.  Moderately large pericardial effusion. Small right and moderate left pleural effusions are also identified. Associated compressive atelectasis is worse on the left.   Electronically Signed   By: TInge RiseM.D.   On: 09/11/2014 20:56   Medications: Scheduled Meds: . aspirin  650 mg Oral 3 times per day  . azithromycin  250 mg Oral Daily  . colchicine  0.6 mg Oral BID  . sodium chloride  3 mL Intravenous Q12H   Continuous Infusions:  PRN Meds:.guaiFENesin-dextromethorphan   Assessment/Plan: 54y/o M w/ no significant PMHx, presented to urgent care 09/11/14 w/ cough and DOE, admitted for pericardial effusion seen on CT.  Pericardial Effusion: Most likely related to infectious process given recent pneumonia. Left-sided pleural effusion also seen in CT corresponding to LLL infiltrate. ESR, CRP elevated, however, this also likely related to infectious  process. HIV negative, ANA pending. Trops negative x3, no obvious EKG changes to suggest associated pericarditis. Patient currently asymptomatic, although persistently tachycardic into the 110's. JVD on exam, extremely faint friction rub only heard w/ patient leaning forward. BP stable. ECHO yesterday showed EF of 60-65%, no obvious findings to suggest tamponade physiology. IVC dilation noted, although patient w/ diastolic dysfunction (grade 2). Possibly related to effusion? -Cardiology following appreciate recs. Given tachycardia, my need pericardiocentesis vs repeat ECHO in 1 week -Continue Aspirin 650 mg tid + Colchicine 0.6 mg bid  -NPO pending possible pericardiocentesis. Restart diet if procedure not indicated.   Dyspnea: Resolved. Tolerating room air.  -Continue to monitor -O2 via [place] prn  Transaminitis: Unclear etiology, although, may be associated w/ hepatic congestion related to effusion. Improved today.  -  Repeat CMP in AM  CAP: No further cough or respiratory symptoms. Completed course of Azithromycin -Robitussin prn for cough  Subconjunctival Hemorrhage: Stable.  -Continue to monitor  DVT/PE PPx: SCD's    Dispo: Disposition is deferred at this time, awaiting improvement of current medical problems.  Anticipated discharge in approximately 1-2 day(s).   The patient does not have a current PCP (No Pcp Per Patient) and does not need an [facility] hospital follow-up appointment after discharge.  He should establish with Family medicine at discharge.     The patient does not have transportation limitations that hinder transportation to clinic appointments.  .Services Needed at time of discharge: Y = Yes, Blank = No PT:   OT:   RN:   Equipment:   Other:     LOS: 2 days   Corky Sox, MD 09/13/2014, 10:59 AM

## 2014-09-13 NOTE — Progress Notes (Signed)
Inpatient Progress Note - Internal Medicine  Subjective: Today the patient is doing well and reports feeling at baseline. He denies chest pain, abdominal pain, SOB/DOE, palpitations. He reports occasionally using O2 via [place] over the last day, but has never felt SOB. He does feel the O2 helps.  Objective: Vital signs in last 24 hours: Filed Vitals:   09/12/14 2045 09/12/14 2349 09/13/14 0500 09/13/14 0830  BP: 121/75 112/45 117/48 119/85  Pulse: 110 112 111 105  Temp: 98.5 F (36.9 C) 97.8 F (36.6 C) 97.5 F (36.4 C) 97.7 F (36.5 C)  TempSrc: Axillary Oral Oral Oral  Resp:  18  18  Height:   _0  (1.575 m)   Weight:   62.732 kg (138 lb 4.8 oz)   SpO2: 97% 94% 99% 100%   Weight change: -0.59 kg (-1 lb 4.8 oz)  Intake/Output Summary (Last 24 hours) at 09/13/14 0843 Last data filed at 09/12/14 1700  Gross per 24 hour  Intake    263 ml  Output      0 ml  Net    263 ml   General: Calm, conversant man sitting up in bed/walking around room HEENT:  R eye with stable hemorrhage, mmm no oropharyngeal erythema Cardiac: Tachycardic with occasional premature beats, mild rub with leaning forward Pulmonary:  CTAB, no increased WOB. Mild JVD Abdominal: soft, nt/nd, no hepatosplenomegaly Extremities: No pedal edema.  Lab Results: Basic Metabolic Panel:  Recent Labs Lab 09/11/14 2201 09/12/14 0355 09/13/14 0300  NA  --  135 133*  K  --  3.8 3.6  CL  --  99* 97*  CO2  --  25 24  GLUCOSE  --  112* 84  BUN  --  7 9  CREATININE  --  0.81 0.73  CALCIUM  --  8.0* 8.2*  MG 2.7*  --  2.4   Liver Function Tests:  Recent Labs Lab 09/12/14 0355 09/13/14 0300  AST 86* 48*  ALT 96* 76*  ALKPHOS 136* 115  BILITOT 5.0* 3.2*  PROT 7.2 6.7  ALBUMIN 2.7* 2.4*   CBC:  Recent Labs Lab 09/11/14 1458 09/13/14 0300  WBC 14.9* 9.8  NEUTROABS  --  7.4  HGB 12.7* 12.1*  HCT 37.1* 36.4*  MCV 86.1 86.9  PLT 623* 594*   Cardiac Enzymes:  Recent Labs Lab 09/11/14 2201  09/12/14 0355 09/12/14 1225  CKTOTAL 333  --   --   CKMB 3.1  --   --   TROPONINI <0.03 <0.03 <0.03   D-Dimer:  Recent Labs Lab 09/11/14 1458  DDIMER 19.97*   Studies/Results: Dg Chest 2 View  09/11/2014   CLINICAL DATA:  Infiltrate.  EXAM: CHEST  2 VIEW  COMPARISON:  09/09/2014 .  FINDINGS: Mediastinum and hilar structures normal. Left base pulmonary infiltrate again noted. Slight interim clearing. Persistent small pleural effusions. Stable cardiomegaly. No pulmonary venous congestion. No acute bony abnormality .  IMPRESSION: 1. Interim slight clearing of left base infiltrate. Persistent small pleural effusions.  2.  Cardiomegaly.  Normal pulmonary vascularity .   Electronically Signed   By: Marcello Moores  Register   On: 09/11/2014 13:55   Ct Angio Chest Pe W/cm &/or Wo Cm  09/11/2014   CLINICAL DATA:  Shortness of breath for 2 days.  EXAM: CT ANGIOGRAPHY CHEST WITH CONTRAST  TECHNIQUE: Multidetector CT imaging of the chest was performed using the standard protocol during bolus administration of intravenous contrast. Multiplanar CT image reconstructions and MIPs were obtained to evaluate the  vascular anatomy.  CONTRAST:  138m OMNIPAQUE IOHEXOL 350 MG/ML SOLN  COMPARISON:  None.  FINDINGS: No pulmonary embolus is identified. The patient has a moderately large pericardial effusion. Small right and small to moderate left pleural effusions are also identified. Heart size is normal. No supraclavicular, axillary, hilar or mediastinal lymphadenopathy is identified.  The lungs demonstrate only atelectatic change, more notable on the left.  Visualized upper abdomen is unremarkable. No focal bony abnormality is identified.  Review of the MIP images confirms the above findings.  IMPRESSION: Negative for pulmonary embolus.  Moderately large pericardial effusion. Small right and moderate left pleural effusions are also identified. Associated compressive atelectasis is worse on the left.   Electronically Signed    By: TInge RiseM.D.   On: 09/11/2014 20:56   Medications: I have reviewed the patient's current medications. Scheduled Meds: . aspirin  650 mg Oral 3 times per day  . azithromycin  250 mg Oral Daily  . colchicine  0.6 mg Oral BID  . sodium chloride  3 mL Intravenous Q12H   Continuous Infusions:  PRN Meds:.guaiFENesin-dextromethorphan Assessment/Plan: Principal Problem:   Pericardial effusion Active Problems:   Periorbital ecchymosis of right eye   Subconjunctival hemorrhage of right eye   Hypokalemia   Thrombocytosis   Hyponatremia   Elevated LFTs 54y.o. M no significant PMH p/w e/o pericardial effusion found incidentally in w/u for CAP.  Pericardial Effusion: Pt was seen at urgent care 5/18 for CAP, cardiometaly seen on CXR. ED CT confirms moderate to large pericardial effusion. No previous films for comparison, but presentation after cough and presumed PNA suggests infectious process. Trop I neg x2, CK/CK-MB WNL. No hypothyroidism. PSA elevated at 3.77 but CT w/o e/o intrapulmonary or cardiac malignancy. ECG no e/o pericarditis. - Cardiology consulted, appreciate recs - Per cardiology, no need for urgent pericardiocentesis as pt is stable. May still need diagnostic pericardiocentesis/pericardial bx. - TTE 5/19: mod-lg pericardial effusion, "some organization." IVC dilation c/w increased CVP. - CRP elevated at 38.5, ESR 84. ANA pending. - Per cardiology, colchicine 0.6 mg BID and ASA 650 mg TID for inflammation - FOBT pending to screen for colorectal malignancy - telemetry  CAP: LLL PNA, seen in urgent care 5/16 and started on Z-pak. Persistent LLL infiltrate and L > R pleural effusion on CXR and CT 5/18. Tmax 100.2 since admission. No SOB at rest, but + DOE. SpO2 82% on RA in ED, 96% on 2L [place]. D-Dimer 19.97 but CTA negative for PE.  - Continue Azithromycin (last day = 5/20) - Guaifenesin-Dextromethorphan q4hr prn for cough - O2 via [place] for SpO2 > 92%  Transaminitis:  AST/ALT 21/23 on 9/21, 47/77 on 5/16 in Urgent Care. Peak 86/96 5/19 AM. Patient with mild scleral icterus, no abdominal pain, no change in BM color/consistency/frequency. May represent hepatic congestion 2/2 pericardial effusion. Total and direct bili elevated. Synthetic function INR, Tprot WNL. UDS negative, EtOH negative. LFTs downtrending. - Continue Azithromycin, caution with other hepatotoxic meds - HIV, viral hepatitis panel negative. - Trend LFTs - Consider liver U/S for worsening or persistent transaminitis  Hyponatremia: Na 128 in Urgent Care. Trending upward, now WNL at 135 on 5/19 AM. Previously WNL on 01/14/2014. Possibly 2/2 increased free water intake. - NPO for now for possible pericardiocentesis, then continue to encourage PO  H/O R Facial Trauma: R eye with conjunctival ecchymosis, subconjunctival hemorrhage. Pt reports waking up on the floor, possibly fell out of bed. H/o epistaxis, now resolved. - CTM  F: None  E: Hypokalemia resolved, hyponatremia resolved. CTM. N: NPO for possible pericardiocentesis, pending cardiology recs  PPX: SCDs - Hold heparin in pericardial effusion  Dispo: Disposition is deferred at this time, awaiting improvement of current medical problems.  Anticipated discharge in approximately 1-2 day(s).   The patient does not have a current PCP (No Pcp Per Patient) and does need an [facility] hospital follow-up appointment after discharge.  The patient does not have transportation limitations that hinder transportation to clinic appointments.  .Services Needed at time of discharge: Y = Yes, Blank = No PT:   OT:   RN:   Equipment:   Other:     LOS: 2 days   Susa Day, Med Student 09/13/2014, 8:43 AM

## 2014-09-13 NOTE — Progress Notes (Signed)
Pt ST on the monitor HR sustaining around 110's. However, pt had a 6 beat run of V tach and multiple episode of MAT, HR going higher than 150's non sustaining. Pt denies any CP.  EKG obtained. Md on call made aware no new orders received. Will cont to monitor pt.

## 2014-09-13 NOTE — Progress Notes (Signed)
Patient Name: Nadir Vasques Date of Encounter: 09/13/2014  Primary Cardiologist: new   Principal Problem:   Pericardial effusion Active Problems:   Periorbital ecchymosis of right eye   Subconjunctival hemorrhage of right eye   Hypokalemia   Thrombocytosis   Hyponatremia   Elevated LFTs    SUBJECTIVE  Denies any CP or SOB.   CURRENT MEDS . aspirin  650 mg Oral 3 times per day  . azithromycin  250 mg Oral Daily  . colchicine  0.6 mg Oral BID  . sodium chloride  3 mL Intravenous Q12H   OBJECTIVE  Filed Vitals:   09/12/14 1805 09/12/14 2045 09/12/14 2349 09/13/14 0500  BP: 122/63 121/75 112/45 117/48  Pulse: 112 110 112 111  Temp: 97.5 F (36.4 C) 98.5 F (36.9 C) 97.8 F (36.6 C) 97.5 F (36.4 C)  TempSrc: Oral Axillary Oral Oral  Resp: 22  18   Height:    5\' 2"  (1.575 m)  Weight:    138 lb 4.8 oz (62.732 kg)  SpO2: 98% 97% 94% 99%    Intake/Output Summary (Last 24 hours) at 09/13/14 0759 Last data filed at 09/12/14 1700  Gross per 24 hour  Intake    263 ml  Output      0 ml  Net    263 ml   Filed Weights   09/12/14 0000 09/12/14 0400 09/13/14 0500  Weight: 139 lb 9.6 oz (63.322 kg) 139 lb 9.6 oz (63.322 kg) 138 lb 4.8 oz (62.732 kg)    PHYSICAL EXAM  General: Pleasant, NAD. Neuro: Alert and oriented X 3. Moves all extremities spontaneously. Psych: Normal affect. HEENT:  Normal  Neck: Supple without bruits  +JVD. Lungs:  Resp regular and unlabored. Decreased breath sound in bibasilar area Heart: Tachycardic. no s3, s4, or murmurs. Abdomen: Soft, non-tender, non-distended, BS + x 4.  Extremities: No clubbing, cyanosis or edema. DP/PT/Radials 2+ and equal bilaterally.  Accessory Clinical Findings  CBC  Recent Labs  09/11/14 1458 09/13/14 0300  WBC 14.9* 9.8  NEUTROABS  --  7.4  HGB 12.7* 12.1*  HCT 37.1* 36.4*  MCV 86.1 86.9  PLT 623* 829*   Basic Metabolic Panel  Recent Labs  09/11/14 2201 09/12/14 0355 09/13/14 0300  NA  --   135 133*  K  --  3.8 3.6  CL  --  99* 97*  CO2  --  25 24  GLUCOSE  --  112* 84  BUN  --  7 9  CREATININE  --  0.81 0.73  CALCIUM  --  8.0* 8.2*  MG 2.7*  --  2.4   Liver Function Tests  Recent Labs  09/12/14 0355 09/13/14 0300  AST 86* 48*  ALT 96* 76*  ALKPHOS 136* 115  BILITOT 5.0* 3.2*  PROT 7.2 6.7  ALBUMIN 2.7* 2.4*   Cardiac Enzymes  Recent Labs  09/11/14 2201 09/12/14 0355 09/12/14 1225  CKTOTAL 333  --   --   CKMB 3.1  --   --   TROPONINI <0.03 <0.03 <0.03   D-Dimer  Recent Labs  09/11/14 1458  DDIMER 19.97*    TELE Sinus tach with HR 100s-110s, some SVT and NSVT    ECG  Sinus tach, poor R wave progression in anterior lead, PVCs  Echocardiogram 09/12/2014  LV EF: 60% -  65%  ------------------------------------------------------------------- Indications:   Dyspnea 786.09. Pericardial effusion 423.9.  ------------------------------------------------------------------- History:  PMH: Pneumonia.  ------------------------------------------------------------------- Study Conclusions  - Left ventricle: The cavity  size was normal. Wall thickness was normal. Systolic function was normal. The estimated ejection fraction was in the range of 60% to 65%. Features are consistent with a pseudonormal left ventricular filling pattern, with concomitant abnormal relaxation and increased filling pressure (grade 2 diastolic dysfunction). - Pericardium, extracardiac: Moderate to large pericardial effusion surrounds heart with some organization. There is no significant mitral inflow variation, no RV collapse. Clinical correlation indicated. There was a left pleural effusion.    Radiology/Studies  Dg Chest 2 View  09/11/2014   CLINICAL DATA:  Infiltrate.  EXAM: CHEST  2 VIEW  COMPARISON:  09/09/2014 .  FINDINGS: Mediastinum and hilar structures normal. Left base pulmonary infiltrate again noted. Slight interim clearing. Persistent  small pleural effusions. Stable cardiomegaly. No pulmonary venous congestion. No acute bony abnormality .  IMPRESSION: 1. Interim slight clearing of left base infiltrate. Persistent small pleural effusions.  2.  Cardiomegaly.  Normal pulmonary vascularity .   Electronically Signed   By: Marcello Moores  Register   On: 09/11/2014 13:55   Dg Chest 2 View  09/09/2014   CLINICAL DATA:  Cough and congestion.  EXAM: CHEST  2 VIEW  COMPARISON:  None.  FINDINGS: Mediastinum and hilar structures normal. Left lower lobe infiltrate consistent pneumonia. Left pleural effusion. Cardiomegaly with normal pulmonary vascularity. Motion artifact present.  IMPRESSION: 1. Left lower lobe infiltrate consistent with pneumonia. Associated small left pleural effusion. 2. Cardiomegaly.  No pulmonary venous congestion.   Electronically Signed   By: Marcello Moores  Register   On: 09/09/2014 12:04   Ct Angio Chest Pe W/cm &/or Wo Cm  09/11/2014   CLINICAL DATA:  Shortness of breath for 2 days.  EXAM: CT ANGIOGRAPHY CHEST WITH CONTRAST  TECHNIQUE: Multidetector CT imaging of the chest was performed using the standard protocol during bolus administration of intravenous contrast. Multiplanar CT image reconstructions and MIPs were obtained to evaluate the vascular anatomy.  CONTRAST:  164mL OMNIPAQUE IOHEXOL 350 MG/ML SOLN  COMPARISON:  None.  FINDINGS: No pulmonary embolus is identified. The patient has a moderately large pericardial effusion. Small right and small to moderate left pleural effusions are also identified. Heart size is normal. No supraclavicular, axillary, hilar or mediastinal lymphadenopathy is identified.  The lungs demonstrate only atelectatic change, more notable on the left.  Visualized upper abdomen is unremarkable. No focal bony abnormality is identified.  Review of the MIP images confirms the above findings.  IMPRESSION: Negative for pulmonary embolus.  Moderately large pericardial effusion. Small right and moderate left pleural  effusions are also identified. Associated compressive atelectasis is worse on the left.   Electronically Signed   By: Inge Rise M.D.   On: 09/11/2014 20:56    ASSESSMENT AND PLAN  54 yo with no significant PMH was admitted with moderate to large pericardial effusion, cough, and low grade fever.  1. Moderate pericardial effusion with possible viral pericarditis  - CTA of chest 09/11/2014 moderately large pericardial effusion, small R and moderate L pleural effusion  - Echo 09/12/2014 EF 09-32%, grade 2 diastolic dysfunction, moderate to large pericardial effusion, no RV collapse  - CRP 38.5, sed rate 84  - No RV collapse noted on echo to suggest hemodynamically compromising tamponade, however patient's HR has been persistently in 110s overnight without significant exertion (also has several NSVT and SVT), will discuss with MD regarding pericardiocentesis with pathology of fluid vs observation with repeat limited echo in 1 wk  2. Sinus tachycardia  - likely related to underlying infectious etiology and pericardial effusion  3. Elevated bilirubin  - maybe related to veinous congestion, transaminase improving  - hepatitis panel negative, HIV lab nonreactive, UDT negative  4. Pleural effusion, L > R  Ruben Im Pager: [phone]   54 yo with no significant PMH was admitted with moderate to large pericardial effusion, cough, and low grade fever.  Echo shows moderate circumferential pericardial effusion, no signs of tamponade. I have reviewed the images and there are no good targets for pericardiocentesis. The patient is tachycardic, but has good BP and denies any dizziness.  His cardiac enzymes are negative suggestive of acute pericarditis with no myocardial involvement.  His leukocytosis has improved.  I would add ibuprofen 400 mg po TID to his regimen and follow him for another day. I wouldn't add betablockers as his tachycardia is possibly a result of acute inflammation and  sepsis.   I would repeat echo in 1 week with a clinic follow up.   Dorothy Spark 09/13/2014

## 2014-09-14 LAB — COMPREHENSIVE METABOLIC PANEL
ALBUMIN: 2.7 g/dL — AB (ref 3.5–5.0)
ALT: 66 U/L — AB (ref 17–63)
AST: 35 U/L (ref 15–41)
Alkaline Phosphatase: 109 U/L (ref 38–126)
Anion gap: 10 (ref 5–15)
BILIRUBIN TOTAL: 2.4 mg/dL — AB (ref 0.3–1.2)
BUN: 8 mg/dL (ref 6–20)
CHLORIDE: 100 mmol/L — AB (ref 101–111)
CO2: 27 mmol/L (ref 22–32)
CREATININE: 0.74 mg/dL (ref 0.61–1.24)
Calcium: 8.8 mg/dL — ABNORMAL LOW (ref 8.9–10.3)
GFR calc Af Amer: >60 mL/min (ref >60)
GFR calc non Af Amer: >60 mL/min (ref >60)
GLUCOSE: 94 mg/dL (ref 65–99)
Potassium: 3.9 mmol/L (ref 3.5–5.1)
Sodium: 137 mmol/L (ref 135–145)
TOTAL PROTEIN: 7.7 g/dL (ref 6.5–8.1)

## 2014-09-14 LAB — GLUCOSE, CAPILLARY: GLUCOSE-CAPILLARY: 90 mg/dL (ref 65–99)

## 2014-09-14 MED ORDER — COLCHICINE 0.6 MG PO TABS: 0.6000 mg | ORAL_TABLET | Freq: Two times a day (BID) | ORAL | Status: DC

## 2014-09-14 MED ORDER — IBUPROFEN 400 MG PO TABS: 400.0000 mg | ORAL_TABLET | Freq: Three times a day (TID) | ORAL | Status: DC

## 2014-09-14 MED ORDER — PANTOPRAZOLE SODIUM 40 MG PO TBEC: 40.0000 mg | DELAYED_RELEASE_TABLET | Freq: Every day | ORAL | Status: DC

## 2014-09-14 MED ORDER — ASPIRIN 325 MG PO TABS: 650.0000 mg | ORAL_TABLET | Freq: Three times a day (TID) | ORAL | Status: DC

## 2014-09-14 NOTE — Discharge Instructions (Signed)
1. You have a follow up appointment as follows:  [place]  Schedule an appointment as soon as possible for a visit in 1 week  [address] [place] [phone]   [address] Office  Schedule an appointment as soon as possible for a visit  [address], [place] [phone]  2. Please take all medications as previously prescribed with the following changes:  Take Colchicine 0.6 mg twice daily + Aspirin 650 mg three times daily + Ibuprofen 400 mg three times daily.   Please also take Protonix 40 mg daily to protect your stomach from the anti-inflammatories listed above.   3. If you have worsening of your symptoms or new symptoms arise, please call the clinic ([phone]), or go to the ER immediately if symptoms are severe.    Pericardial Effusion Pericardial effusion is an accumulation of extra fluid in the pericardial space. This is the space between the heart and the sac that surrounds it. This space normally contains a small amount of fluid which serves as lubrication for the heart inside the sac. If the amount of fluid increases, it causes problems for the working of the heart. This is called a heart (cardiac) tamponade.  CAUSES  A higher incidence of pericardial effusion is associated with certain diseases. Some common causes of pericardial effusion are:  Infections (bacterial, viral, fungal, from AIDS, etc.).  Cancer which has spread to the pericardial sac.  Fluid accumulation in the sac after a heart attack or open heart surgery.  Injury (a fall with chest injury or as a result of a knife or gunshot wound) with bleeding into the pericardial sac.  Immune diseases (such as Rheumatoid Arthritis) and other arthritis conditions.  Reactions (uncommon) to medications. SYMPTOMS  Very small effusions may cause no problems. Even a small effusion if it comes on rapidly can be  deadly. Some common symptoms are:  Chest pain, pressure, discomfort.  Light-headed feeling, fainting.  Cough, shortness of breath.  Feeling of palpitations.  Hiccoughs  Anxiety or confusion DIAGNOSIS  Your caregiver may do a number of blood tests and imaging tests (like X-rays) to help find the cause.   ECHO (Echocardiographic stress test) has become the diagnostic method of choice. This is due to its portability and availability. CT and MRI are also used, and may be more accurate.  Pericardioscopy, where available, uses a telescope-like instrument to look inside the pericardial sac.  This may be helpful in cases of unexplained pericardial effusions. It allows your caregiver to look at the pericardium and do pericardial biopsies if something abnormal is found.  Sometimes fluid may be removed to help with diagnosing the cause of the accumulation. This is called a pericardiocentesis. TREATMENT  Treatment is directed at removal of the extra pericardial fluid and treating the cause. Small amounts of effusion that are not causing problems can simply be watched. If the fluid is accumulating rapidly and resulting in cardiac tamponade, this can be life-threatening. Emergency removal of fluid must be done.  SEEK IMMEDIATE MEDICAL CARE IF:   You develop chest pain, irregular heart beat (palpitations) or racing heart, shortness of breath, or begin sweating.  You become lightheaded or pass out.  You develop increasing swelling of the legs. Document Released: 12/08/2004 Document Revised: 07/05/2011 Document Reviewed: 11/24/2007 [facility] Patient Information 2015 [place], [place]. This information is not intended to replace advice given to you by your health care provider. Make sure  you discuss any questions you have with your health care provider.

## 2014-09-14 NOTE — Progress Notes (Signed)
Subjective: Patient w/ no acute issues. Feels great, walking around with no symptoms. Denies chest pain or SOB.   Objective: Vital signs in last 24 hours: Filed Vitals:   09/13/14 0830 09/13/14 2020 09/13/14 2255 09/14/14 0400  BP: 119/85 111/70 111/74 120/76  Pulse: 105 107 98 106  Temp: 97.7 F (36.5 C) 98.4 F (36.9 C) 98.2 F (36.8 C) 98.1 F (36.7 C)  TempSrc: Oral Oral Oral Oral  Resp: '18 18 17   ' Height:      Weight:      SpO2: 100% 98% 97% 99%   Weight change:   Intake/Output Summary (Last 24 hours) at 09/14/14 1203 Last data filed at 09/13/14 2230  Gross per 24 hour  Intake      6 ml  Output      0 ml  Net      6 ml   Physical Exam: General: AA male, alert, cooperative, NAD. HEENT: PERRL, EOMI. Moist mucus membranes. Right lateral/periorbital abrasion, conjunctival hemorrhage.  Neck: Full range of motion without pain, supple, no lymphadenopathy or carotid bruits. JVD on exam.  Lungs: Clear to ascultation bilaterally, normal work of respiration, no wheezes, rales, rhonchi Heart: RRR, no murmurs or gallops. POSSIBLE faint friction rub while sitting up.  Abdomen: Soft, non-tender, non-distended, BS + Extremities: No cyanosis, clubbing, or edema Neurologic: Alert & oriented x3, cranial nerves II-XII intact, strength grossly intact, sensation intact to light touch    Lab Results: Basic Metabolic Panel:  Recent Labs Lab 09/11/14 2201  09/13/14 0300 09/14/14 0550  NA  --   < > 133* 137  K  --   < > 3.6 3.9  CL  --   < > 97* 100*  CO2  --   < > 24 27  GLUCOSE  --   < > 84 94  BUN  --   < > 9 8  CREATININE  --   < > 0.73 0.74  CALCIUM  --   < > 8.2* 8.8*  MG 2.7*  --  2.4  --   < > = values in this interval not displayed.   Liver Function Tests:  Recent Labs Lab 09/13/14 0300 09/14/14 0550  AST 48* 35  ALT 76* 66*  ALKPHOS 115 109  BILITOT 3.2* 2.4*  PROT 6.7 7.7  ALBUMIN 2.4* 2.7*   CBC:  Recent Labs Lab 09/11/14 1458 09/13/14 0300    WBC 14.9* 9.8  NEUTROABS  --  7.4  HGB 12.7* 12.1*  HCT 37.1* 36.4*  MCV 86.1 86.9  PLT 623* 594*   Cardiac Enzymes:  Recent Labs Lab 09/11/14 2201 09/12/14 0355 09/12/14 1225  CKTOTAL 333  --   --   CKMB 3.1  --   --   TROPONINI <0.03 <0.03 <0.03   D-Dimer:  Recent Labs Lab 09/11/14 1458  DDIMER 19.97*   Thyroid Function Tests:  Recent Labs Lab 09/09/14 1144  TSH 1.814  FREET4 1.23  T3FREE 2.7   Coagulation:  Recent Labs Lab 09/11/14 2201 09/12/14 0355  LABPROT 15.4* 15.8*  INR 1.20 1.24   Urine Drug Screen: Drugs of Abuse     Component Value Date/Time   LABOPIA NONE DETECTED 09/11/2014 2218   COCAINSCRNUR NONE DETECTED 09/11/2014 2218   LABBENZ NONE DETECTED 09/11/2014 2218   AMPHETMU NONE DETECTED 09/11/2014 2218   THCU NONE DETECTED 09/11/2014 2218   LABBARB NONE DETECTED 09/11/2014 2218    Alcohol Level:  Recent Labs Lab 09/11/14 2211  ETH <  5    Misc. Labs:  ANA pending HIV negative ESR 84 CRP 38.5  Micro Results: Recent Results (from the past 240 hour(s))  MRSA PCR Screening     Status: None   Collection Time: 09/11/14 10:40 PM  Result Value Ref Range Status   MRSA by PCR NEGATIVE NEGATIVE Final    Comment:        The GeneXpert MRSA Assay (FDA approved for NASAL specimens only), is one component of a comprehensive MRSA colonization surveillance program. It is not intended to diagnose MRSA infection nor to guide or monitor treatment for MRSA infections.    Studies/Results: No results found. Medications: Scheduled Meds: . aspirin  650 mg Oral 3 times per day  . colchicine  0.6 mg Oral BID  . ibuprofen  400 mg Oral TID WC  . pantoprazole  40 mg Oral QAC breakfast  . sodium chloride  3 mL Intravenous Q12H   Continuous Infusions:  PRN Meds:.guaiFENesin-dextromethorphan   Assessment/Plan: 54 y/o M w/ no significant PMHx, presented to urgent care 09/11/14 w/ cough and DOE, admitted for pericardial effusion seen on  CT.  Pericardial Effusion: Most likely related to infectious process given recent pneumonia. Completely asymptomatic on exam.  -Cardiology following appreciate recs.  -Continue Aspirin 650 mg tid + Colchicine 0.6 mg bid + Ibuprofen 400 mg tid -Will need close follow up and repeat ECHO in 1 week  Dyspnea: Resolved. Tolerating room air.  -Continue to monitor -O2 via [place] prn  Transaminitis: Unclear etiology, although, may be associated w/ hepatic congestion related to effusion. Continues to improve.  -Repeat CMP in outpatient clinic.   CAP: No further cough or respiratory symptoms. Completed course of Azithromycin -Robitussin prn for cough  Subconjunctival Hemorrhage: Stable.  -Continue to monitor  DVT/PE PPx: SCD's    Dispo: Anticipated discharge today.   The patient does not have a current PCP (No Pcp Per Patient) and does not need an [facility] hospital follow-up appointment after discharge.  He should establish with Family medicine at discharge.     The patient does not have transportation limitations that hinder transportation to clinic appointments.  .Services Needed at time of discharge: Y = Yes, Blank = No PT:   OT:   RN:   Equipment:   Other:     LOS: 3 days   Corky Sox, MD 09/14/2014, 12:03 PM

## 2014-09-14 NOTE — Progress Notes (Signed)
Inpatient Progress Note - Internal Medicine  Subjective: Today the patient is feeling well with no complaints. He is walking around the room and is comfortable. He denies SOB, CP, dizziness, palpitations, abdominal pain.  Objective: Vital signs in last 24 hours: Filed Vitals:   09/13/14 0830 09/13/14 2020 09/13/14 2255 09/14/14 0400  BP: 119/85 111/70 111/74 120/76  Pulse: 105 107 98 106  Temp: 97.7 F (36.5 C) 98.4 F (36.9 C) 98.2 F (36.8 C) 98.1 F (36.7 C)  TempSrc: Oral Oral Oral Oral  Resp: '18 18 17   ' Height:      Weight:      SpO2: 100% 98% 97% 99%   Weight change:   Intake/Output Summary (Last 24 hours) at 09/14/14 1149 Last data filed at 09/13/14 2230  Gross per 24 hour  Intake      6 ml  Output      0 ml  Net      6 ml   General: Alert, conversant, walking around the room HEENT:  EOMI, R conjunctival ecchymosis resolving, mmm no oropharyngeal erythema Cardiac: RRR, no m/r/g appreciated. Mild JVD Pulmonary:  Mildly diminished bibasilar breath sounds, no increased WOB Abdominal: soft, nt/nd, no hepatosplenomegaly, normoactive bowel sounds Extremities: No pedal edema.  Neurologic:  Alert, content of speech appropriate. Normal gait, walking comfortably.  Lab Results: Basic Metabolic Panel:  Recent Labs Lab 09/11/14 2201  09/13/14 0300 09/14/14 0550  NA  --   < > 133* 137  K  --   < > 3.6 3.9  CL  --   < > 97* 100*  CO2  --   < > 24 27  GLUCOSE  --   < > 84 94  BUN  --   < > 9 8  CREATININE  --   < > 0.73 0.74  CALCIUM  --   < > 8.2* 8.8*  MG 2.7*  --  2.4  --   < > = values in this interval not displayed. Liver Function Tests:  Recent Labs Lab 09/13/14 0300 09/14/14 0550  AST 48* 35  ALT 76* 66*  ALKPHOS 115 109  BILITOT 3.2* 2.4*  PROT 6.7 7.7  ALBUMIN 2.4* 2.7*   CBG:  Recent Labs Lab 09/14/14 0730  GLUCAP 90   Medications: I have reviewed the patient's current medications. Scheduled Meds: . aspirin  650 mg Oral 3 times per day   . colchicine  0.6 mg Oral BID  . ibuprofen  400 mg Oral TID WC  . pantoprazole  40 mg Oral QAC breakfast  . sodium chloride  3 mL Intravenous Q12H   Continuous Infusions:  PRN Meds:.guaiFENesin-dextromethorphan Assessment/Plan: Principal Problem:   Pericardial effusion Active Problems:   Periorbital ecchymosis of right eye   Subconjunctival hemorrhage of right eye   Hypokalemia   Thrombocytosis   Hyponatremia   Elevated LFTs 54 y.o. M no significant PMH p/w e/o pericardial effusion found incidentally in w/u for CAP.  Pericardial Effusion: Pt was seen at urgent care 5/18 for CAP, cardiometaly seen on CXR. ED CT confirms moderate to large pericardial effusion. No previous films for comparison, but presentation after cough and presumed PNA suggests infectious process. Trop I neg x3, CK/CK-MB WNL. No hypothyroidism. PSA elevated at 3.77 but CT w/o e/o intrapulmonary or cardiac malignancy. ECG no e/o pericarditis. - Cardiology consulted, appreciate recs - Per cardiology, no need for urgent pericardiocentesis as pt is stable. OK with d/c, will f/u with ECHO 1 wk after d/c. -  TTE 5/19: mod-lg pericardial effusion, IVC dilation c/w increased CVP. - CRP elevated at 38.5, ESR 84. ANA inpatient orders cancelled; will f/u with ANA in clinic f/u appt. - Per cardiology, colchicine 0.6 mg BID, ASA 650 mg TID, ibuprofen 400 mg TID for inflammation - FOBT pending to screen for colorectal malignancy - telemetry  CAP: LLL PNA, seen in urgent care 5/16 and started on Z-pak. Persistent LLL infiltrate and L > R pleural effusion on CXR and CT 5/18. Tmax 100.2 since admission. No SOB at rest, but + DOE. SpO2 82% on RA in ED, 96% on 2L [place]. D-Dimer 19.97 but CTA negative for PE. Pt currently asx: no cough, no DOE/SOB, sat good on RA.Resolved. - s/p 5 days Azithromycin (last day = 5/20) - Guaifenesin-Dextromethorphan q4hr prn for cough  Transaminitis: AST/ALT 21/23 on 9/21, peak 86/96 5/19 AM. Patient with  mild scleral icterus, no abdominal pain, no change in BM color/consistency/frequency. May represent hepatic congestion 2/2 pericardial effusion. Total and direct bili elevated. Synthetic function INR, Tprot WNL. UDS negative, EtOH negative. LFTs downtrending. - HIV, viral hepatitis panel negative. - AST/ALT 35/66 today AM. Will re-check in f/u appt.  Hyponatremia: Na 128 in Urgent Care. WNL this admission. Possibly 2/2 increased free water intake. Resolved. - Normal diet  H/O R Facial Trauma: R eye with conjunctival ecchymosis, subconjunctival hemorrhage. Pt reports waking up on the floor, possibly fell out of bed. H/o epistaxis, now resolved. - CTM  F: None E: Hypokalemia resolved, hyponatremia resolved. CTM. N: NPO for possible pericardiocentesis, pending cardiology recs  PPX: SCDs - Hold heparin in pericardial effusion  Dispo: Anticipated discharge today.   The patient does not have a current PCP (No Pcp Per Patient) and does need an [facility] hospital follow-up appointment after discharge.  The patient does not have transportation limitations that hinder transportation to clinic appointments.  .Services Needed at time of discharge: Y = Yes, Blank = No PT:   OT:   RN:   Equipment:   Other:     LOS: 3 days   Susa Day, Med Student 09/14/2014, 11:49 AM

## 2014-09-14 NOTE — Discharge Summary (Signed)
Name: Clifford Murillo MRN: [id number] DOB: [birth year] 54 y.o. PCP: No Pcp Per Patient  Date of Admission: 09/11/2014  2:55 PM Date of Discharge: 09/14/2014 Attending Physician: Madilyn Fireman, MD  Discharge Diagnosis:  Principal Problem:   Pericardial effusion Active Problems:   Periorbital ecchymosis of right eye   Subconjunctival hemorrhage of right eye   Hypokalemia   Thrombocytosis   Hyponatremia   Elevated LFTs  Discharge Medications:   Medication List    STOP taking these medications        azithromycin 250 MG tablet  Commonly known as:  ZITHROMAX Z-PAK     HYDROcodone-homatropine 5-1.5 MG/5ML syrup  Commonly known as:  HYCODAN      TAKE these medications        aspirin 325 MG tablet  Take 2 tablets (650 mg total) by mouth every 8 (eight) hours.     colchicine 0.6 MG tablet  Take 1 tablet (0.6 mg total) by mouth 2 (two) times daily.     ibuprofen 400 MG tablet  Commonly known as:  ADVIL,MOTRIN  Take 1 tablet (400 mg total) by mouth 3 (three) times daily.     pantoprazole 40 MG tablet  Commonly known as:  PROTONIX  Take 1 tablet (40 mg total) by mouth daily before breakfast.        Disposition and follow-up:   Clifford Murillo was discharged from [facility] in Good condition.  At the hospital follow up visit please address:  1.  HR, respiratory status, pericardial friction rub? Is patient having symptoms? Needs repeat ECHO.   2.  Labs / imaging needed at time of follow-up: ECHO  3.  Pending labs/ test needing follow-up: None  Follow-up Appointments:     Follow-up Information    Follow up with [facility]. Schedule an appointment as soon as possible for a visit in 1 week.   Contact information:   [address] [place] [phone]      Schedule an appointment as soon as possible for a visit with [facility].   Specialty:  Cardiology   Contact  information:   [address], [place] [phone]      Discharge Instructions: Discharge Instructions    Diet - low sodium heart healthy    Complete by:  As directed      Increase activity slowly    Complete by:  As directed            Consultations: Treatment Team:  Rounding Lbcardiology, MD  Procedures Performed:  Dg Chest 2 View  09/11/2014   CLINICAL DATA:  Infiltrate.  EXAM: CHEST  2 VIEW  COMPARISON:  09/09/2014 .  FINDINGS: Mediastinum and hilar structures normal. Left base pulmonary infiltrate again noted. Slight interim clearing. Persistent small pleural effusions. Stable cardiomegaly. No pulmonary venous congestion. No acute bony abnormality .  IMPRESSION: 1. Interim slight clearing of left base infiltrate. Persistent small pleural effusions.  2.  Cardiomegaly.  Normal pulmonary vascularity .   Electronically Signed   By: Clifford Murillo  Register   On: 09/11/2014 13:55   Dg Chest 2 View  09/09/2014   CLINICAL DATA:  Cough and congestion.  EXAM: CHEST  2 VIEW  COMPARISON:  None.  FINDINGS: Mediastinum and hilar structures normal. Left lower lobe infiltrate consistent pneumonia. Left pleural effusion. Cardiomegaly with normal pulmonary vascularity. Motion artifact present.  IMPRESSION: 1. Left lower lobe infiltrate consistent  with pneumonia. Associated small left pleural effusion. 2. Cardiomegaly.  No pulmonary venous congestion.   Electronically Signed   By: Clifford Murillo  Register   On: 09/09/2014 12:04   Ct Angio Chest Pe W/cm &/or Wo Cm  09/11/2014   CLINICAL DATA:  Shortness of breath for 2 days.  EXAM: CT ANGIOGRAPHY CHEST WITH CONTRAST  TECHNIQUE: Multidetector CT imaging of the chest was performed using the standard protocol during bolus administration of intravenous contrast. Multiplanar CT image reconstructions and MIPs were obtained to evaluate the vascular anatomy.  CONTRAST:  122m OMNIPAQUE IOHEXOL 350 MG/ML SOLN  COMPARISON:  None.   FINDINGS: No pulmonary embolus is identified. The patient has a moderately large pericardial effusion. Small right and small to moderate left pleural effusions are also identified. Heart size is normal. No supraclavicular, axillary, hilar or mediastinal lymphadenopathy is identified.  The lungs demonstrate only atelectatic change, more notable on the left.  Visualized upper abdomen is unremarkable. No focal bony abnormality is identified.  Review of the MIP images confirms the above findings.  IMPRESSION: Negative for pulmonary embolus.  Moderately large pericardial effusion. Small right and moderate left pleural effusions are also identified. Associated compressive atelectasis is worse on the left.   Electronically Signed   By: TInge RiseM.D.   On: 09/11/2014 20:56    2D Echo: 09/11/13  Study Conclusions  - Left ventricle: The cavity size was normal. Wall thickness was normal. Systolic function was normal. The estimated ejection fraction was in the range of 60% to 65%. Features are consistent with a pseudonormal left ventricular filling pattern, with concomitant abnormal relaxation and increased filling pressure (grade 2 diastolic dysfunction). - Pericardium, extracardiac: Moderate to large pericardial effusion surrounds heart with some organization. There is no significant mitral inflow variation, no RV collapse. Clinical correlation indicated. There was a left pleural effusion.   Admission HPI: [name] a 54year old man with no known past medical history who presented to ED on 09/11/2014 with SOB and cough. He originally went to urgent care 09/09/2014 for a few days of SOB and non-productive cough. CXR at that time showed LLL infiltrate consistent with pneumonia, small L pleural effusion, and cardiomegaly. He was discharged with a z-pack and referred to cardiology. He returned to urgent care on the day of admission saying he felt improved. He was still tachycardic into  the 120s and had repeat CXR with unchanged cardiomegaly and slight improvement in the lung infiltrate. Urgent care provider spoke to cardiology who asked for patient to come to ED for evaluation of possible pericardial effusion.  On interview in ED, patient says he felt his SOB and cough had improved. He had ecchymosis and conjunctival hemorrhage of the R eye and reported this was because he woke up on the floor the day before and assumed he fell out of bed. He had noted palpitations for the past few days but otherwise denied any chest pain, abdominal pain, nausea, emesis, diarrhea, constipation, melena, hematochezia, dysuria, headache, lightheadedness, dizziness, vision change, eye pain, weakness, numbness, lower extremity swelling. No sick contacts. [place] He denied alcohol, drugs, and cigarettes. Only known family history is mother has DM2 but no known family cardiac history. He is s/p hernia repair but otherwise has no known history. In the ED, he had saturation down to 82% and was placed on 2 L nasal cannula. He had a D-dimer of 20 so got chest CTA which demonstrated  moderate to large pericardial effusion, small R and moderate L pleural effusions, associated compressive atelectasis worse on L, and negative for PE. The patient was admitted to the internal medicine teaching service on 09/11/2014. He was evaluated for causes of new pericardial effusion and monitored clinically. He was discharged to home on 09/14/2014. On the day of discharge, the patient was asymptomatic and stable without complaints.  Cardiology was consulted during this admission; their recommendations are greatly appreciated.  No procedures were performed during the course of this hospitalization.   Hospital Course by problem list: Principal Problem:   Pericardial effusion Active Problems:   Periorbital ecchymosis of right eye   Subconjunctival hemorrhage of right eye    Hypokalemia   Thrombocytosis   Hyponatremia   Elevated LFTs   1. Pericardial Effusion: Cardiomegaly incidentally found on CXR in urgent care 09/09/2014 in workup for CAP, seen on CT in ED on day of admission. No prior films to establish timeline. Echocardiogram performed 09/12/2014 demonstrates moderate to large pericardial effusion, grade II diastolic dysfunction, IVC dilation consistent with elevated CVP. Normal TSH/FT4/FT3, normal CK, CK-MB. ESR and CRP elevated but patient also with resolving pneumonia. ANA should be drawn on hospital f/u visit. No evidence of pericarditis on EKG. Mild rub on exam, improving over hospital course. Likely infectious cause given concurrent LLL infiltrate. Cardiology was consulted and started the patient on anti-inflammatory regimen ASA 650 mg TID, colchicine 0.6 mg BID, ibuprofen 400 mg TID. He was also started on Protonix 40 mg daily given high-dose NSAID regimen. The patient will follow up with cardiology for repeat echocardiogram after discharge.  2. Community-acquired Pneumonia: The patient was seen in urgent care on 09/08/2014 and started on 5 days Azithromycin, which was completed during this hospitalization. At time of admission the patient had mild nonproductive cough that resolved over the course of this hospitalization. He received Guafenesin-Dextromethorphan syrup q4hr for cough. At time of discharge, the patient was stable with no SOB, DOE, or cough.  3. Elevated Liver Aminotransferases: Transaminases mildly elevated throughout this admission, peak AST 86 ALT 96. Total and direct bilirubin elevated, INR normal suggesting normal synthetic liver function. Likely represents mild liver congestion in the setting of pericardial effusion and grade II diastolic dysfunction with elevated CVP. AST, ALT trending down at time of discharge; recommend re-check at hospital follow-up visit.  4. Normocytic Anemia: The patient was mildly anemic with serum Hgb of 12.7 on admission  and MCV of 86. FOBT was ordered but never obtained. Recommend outpatient colonoscopy after discharge.  5. Hypokalemia: The patient's serum potassium was 3.4 on admission; it corrected after 40 mEq KDur PO and was stable throughout this hospitalization.  6. Hyponatremia: The patient's serum sodium was 131 on admission. He was clinically euvolemic. His serum sodium corrected spontaneously over the course of this hospitalization.  Discharge Vitals:   BP 120/76 mmHg  Pulse 106  Temp(Src) 98.1 F (36.7 C) (Oral)  Resp 17  Ht '5\' 2"'  (1.575 m)  Wt 138 lb 4.8 oz (62.732 kg)  BMI 25.29 kg/m2  SpO2 99%  Discharge Labs:  Results for orders placed or performed during the hospital encounter of 09/11/14 (from the past 24 hour(s))  Comprehensive metabolic panel     Status: Abnormal   Collection Time: 09/14/14  5:50 AM  Result Value Ref Range   Sodium 137 135 - 145 mmol/L   Potassium 3.9 3.5 - 5.1 mmol/L   Chloride 100 (L) 101 - 111 mmol/L   CO2 27 22 - 32  mmol/L   Glucose, Bld 94 65 - 99 mg/dL   BUN 8 6 - 20 mg/dL   Creatinine, Ser 0.74 0.61 - 1.24 mg/dL   Calcium 8.8 (L) 8.9 - 10.3 mg/dL   Total Protein 7.7 6.5 - 8.1 g/dL   Albumin 2.7 (L) 3.5 - 5.0 g/dL   AST 35 15 - 41 U/L   ALT 66 (H) 17 - 63 U/L   Alkaline Phosphatase 109 38 - 126 U/L   Total Bilirubin 2.4 (H) 0.3 - 1.2 mg/dL   GFR calc non Af Amer >60 >60 mL/min   GFR calc Af Amer >60 >60 mL/min   Anion gap 10 5 - 15  Glucose, capillary     Status: None   Collection Time: 09/14/14  7:30 AM  Result Value Ref Range   Glucose-Capillary 90 65 - 99 mg/dL    Signed: Corky Sox, MD 09/14/2014, 12:14 PM    Services Ordered on Discharge: None Equipment Ordered on Discharge: None

## 2014-09-14 NOTE — Progress Notes (Addendum)
Patient Name: Clifford Murillo Date of Encounter: 09/14/2014  Primary Cardiologist: Aundra Dubin / Meda Coffee    Principal Problem:   Pericardial effusion Active Problems:   Periorbital ecchymosis of right eye   Subconjunctival hemorrhage of right eye   Hypokalemia   Thrombocytosis   Hyponatremia   Elevated LFTs    SUBJECTIVE 54 year old gentleman who was admitted with fever, cough, moderate to large pericardial effusion. Echocardiogram does not suggest any evidence of cardiac tamponade  Denies any CP or SOB.   CURRENT MEDS . aspirin  650 mg Oral 3 times per day  . colchicine  0.6 mg Oral BID  . ibuprofen  400 mg Oral TID WC  . pantoprazole  40 mg Oral QAC breakfast  . sodium chloride  3 mL Intravenous Q12H   OBJECTIVE  Filed Vitals:   09/13/14 0830 09/13/14 2020 09/13/14 2255 09/14/14 0400  BP: 119/85 111/70 111/74 120/76  Pulse: 105 107 98 106  Temp: 97.7 F (36.5 C) 98.4 F (36.9 C) 98.2 F (36.8 C) 98.1 F (36.7 C)  TempSrc: Oral Oral Oral Oral  Resp: 18 18 17    Height:      Weight:      SpO2: 100% 98% 97% 99%    Intake/Output Summary (Last 24 hours) at 09/14/14 1042 Last data filed at 09/13/14 2230  Gross per 24 hour  Intake      6 ml  Output      0 ml  Net      6 ml   Filed Weights   09/12/14 0000 09/12/14 0400 09/13/14 0500  Weight: 63.322 kg (139 lb 9.6 oz) 63.322 kg (139 lb 9.6 oz) 62.732 kg (138 lb 4.8 oz)    PHYSICAL EXAM  General: Pleasant, NAD. Neuro: Alert and oriented X 3. Moves all extremities spontaneously. Psych: Normal affect. HEENT:  Normal  Neck: Supple without bruits  +JVD. Lungs:  Resp regular and unlabored. Decreased breath sound in bibasilar area Heart: Tachycardic. no s3, s4, or murmurs. 2 component friction rub  Abdomen: Soft, non-tender, non-distended, BS + x 4.  Extremities: No clubbing, cyanosis or edema. DP/PT/Radials 2+ and equal bilaterally.  Accessory Clinical Findings  CBC  Recent Labs  09/11/14 1458 09/13/14 0300   WBC 14.9* 9.8  NEUTROABS  --  7.4  HGB 12.7* 12.1*  HCT 37.1* 36.4*  MCV 86.1 86.9  PLT 623* 177*   Basic Metabolic Panel  Recent Labs  09/11/14 2201  09/13/14 0300 09/14/14 0550  NA  --   < > 133* 137  K  --   < > 3.6 3.9  CL  --   < > 97* 100*  CO2  --   < > 24 27  GLUCOSE  --   < > 84 94  BUN  --   < > 9 8  CREATININE  --   < > 0.73 0.74  CALCIUM  --   < > 8.2* 8.8*  MG 2.7*  --  2.4  --   < > = values in this interval not displayed. Liver Function Tests  Recent Labs  09/13/14 0300 09/14/14 0550  AST 48* 35  ALT 76* 66*  ALKPHOS 115 109  BILITOT 3.2* 2.4*  PROT 6.7 7.7  ALBUMIN 2.4* 2.7*   Cardiac Enzymes  Recent Labs  09/11/14 2201 09/12/14 0355 09/12/14 1225  CKTOTAL 333  --   --   CKMB 3.1  --   --   TROPONINI <0.03 <0.03 <0.03   D-Dimer  Recent Labs  09/11/14 1458  DDIMER 19.97*    TELE Sinus tach with HR 100s-110s, some SVT and NSVT    ECG  Sinus tach, poor R wave progression in anterior lead, PVCs  Echocardiogram 09/12/2014  LV EF: 60% -  65%  ------------------------------------------------------------------- Indications:   Dyspnea 786.09. Pericardial effusion 423.9.  ------------------------------------------------------------------- History:  PMH: Pneumonia.  ------------------------------------------------------------------- Study Conclusions  - Left ventricle: The cavity size was normal. Wall thickness was normal. Systolic function was normal. The estimated ejection fraction was in the range of 60% to 65%. Features are consistent with a pseudonormal left ventricular filling pattern, with concomitant abnormal relaxation and increased filling pressure (grade 2 diastolic dysfunction). - Pericardium, extracardiac: Moderate to large pericardial effusion surrounds heart with some organization. There is no significant mitral inflow variation, no RV collapse. Clinical correlation indicated. There was a  left pleural effusion.    Radiology/Studies  Dg Chest 2 View  09/11/2014   CLINICAL DATA:  Infiltrate.  EXAM: CHEST  2 VIEW  COMPARISON:  09/09/2014 .  FINDINGS: Mediastinum and hilar structures normal. Left base pulmonary infiltrate again noted. Slight interim clearing. Persistent small pleural effusions. Stable cardiomegaly. No pulmonary venous congestion. No acute bony abnormality .  IMPRESSION: 1. Interim slight clearing of left base infiltrate. Persistent small pleural effusions.  2.  Cardiomegaly.  Normal pulmonary vascularity .   Electronically Signed   By: Marcello Moores  Register   On: 09/11/2014 13:55   Dg Chest 2 View  09/09/2014   CLINICAL DATA:  Cough and congestion.  EXAM: CHEST  2 VIEW  COMPARISON:  None.  FINDINGS: Mediastinum and hilar structures normal. Left lower lobe infiltrate consistent pneumonia. Left pleural effusion. Cardiomegaly with normal pulmonary vascularity. Motion artifact present.  IMPRESSION: 1. Left lower lobe infiltrate consistent with pneumonia. Associated small left pleural effusion. 2. Cardiomegaly.  No pulmonary venous congestion.   Electronically Signed   By: Marcello Moores  Register   On: 09/09/2014 12:04   Ct Angio Chest Pe W/cm &/or Wo Cm  09/11/2014   CLINICAL DATA:  Shortness of breath for 2 days.  EXAM: CT ANGIOGRAPHY CHEST WITH CONTRAST  TECHNIQUE: Multidetector CT imaging of the chest was performed using the standard protocol during bolus administration of intravenous contrast. Multiplanar CT image reconstructions and MIPs were obtained to evaluate the vascular anatomy.  CONTRAST:  1108mL OMNIPAQUE IOHEXOL 350 MG/ML SOLN  COMPARISON:  None.  FINDINGS: No pulmonary embolus is identified. The patient has a moderately large pericardial effusion. Small right and small to moderate left pleural effusions are also identified. Heart size is normal. No supraclavicular, axillary, hilar or mediastinal lymphadenopathy is identified.  The lungs demonstrate only atelectatic change, more  notable on the left.  Visualized upper abdomen is unremarkable. No focal bony abnormality is identified.  Review of the MIP images confirms the above findings.  IMPRESSION: Negative for pulmonary embolus.  Moderately large pericardial effusion. Small right and moderate left pleural effusions are also identified. Associated compressive atelectasis is worse on the left.   Electronically Signed   By: Inge Rise M.D.   On: 09/11/2014 20:56    ASSESSMENT AND PLAN  54 yo with no significant PMH was admitted with moderate to large pericardial effusion, cough, and low grade fever.  1. Moderate pericardial effusion with possible viral pericarditis  - CTA of chest 09/11/2014 moderately large pericardial effusion, small R and moderate L pleural effusion  - Echo 09/12/2014 EF 60-73%, grade 2 diastolic dysfunction, moderate to large pericardial effusion, no RV collapse  -  CRP 38.5, sed rate 84  - No RV collapse noted on echo to suggest hemodynamically compromising tamponade, however patient's HR has been persistently in 110s overnight without significant exertion (also has several NSVT and SVT), will discuss with MD regarding pericardiocentesis with pathology of fluid vs observation with repeat limited echo in 1 wk  2. Sinus tachycardia  - likely related to underlying infectious etiology and pericardial effusion  3. Elevated bilirubin  - maybe related to veinous congestion, transaminase improving  - hepatitis panel negative, HIV lab nonreactive, UDT negative  4. Pleural effusion, L > R  His cardiac enzymes are negative suggestive of acute pericarditis with no myocardial involvement.  His leukocytosis has improved.  I would add ibuprofen 400 mg po TID to his regimen and follow him for another day. I wouldn't add betablockers as his tachycardia is possibly a result of acute inflammation and sepsis.   I would repeat echo in 1 week with a clinic follow up.   He is walking around and is asymptomatic    Ok to go home when Int. Medicine gives the ok    Alexee Delsanto, Wonda Cheng, MD  09/14/2014 10:53 AM    [facility] [place],  [place], [place]  [address] Pager [phone] Phone: [phone]; Fax: [phone]   [facility]  [address] [place], [place]  [address] [phone]    Fax [phone]

## 2014-09-18 ENCOUNTER — Encounter: Payer: Self-pay | Admitting: Nurse Practitioner

## 2014-09-18 ENCOUNTER — Ambulatory Visit (INDEPENDENT_AMBULATORY_CARE_PROVIDER_SITE_OTHER): Payer: 59 | Admitting: Nurse Practitioner

## 2014-09-18 VITALS — BP 126/84 | HR 108 | Ht 63.0 in | Wt 140.8 lb

## 2014-09-18 DIAGNOSIS — I3139 Other pericardial effusion (noninflammatory): Secondary | ICD-10-CM

## 2014-09-18 DIAGNOSIS — I319 Disease of pericardium, unspecified: Secondary | ICD-10-CM

## 2014-09-18 DIAGNOSIS — I313 Pericardial effusion (noninflammatory): Secondary | ICD-10-CM

## 2014-09-18 NOTE — Progress Notes (Signed)
CARDIOLOGY OFFICE NOTE  Date:  09/18/2014    Clifford Murillo Date of Birth: 10-28-1960 Medical Record #[id number]  PCP:  No PCP Per Patient  Cardiologist:  [place]    Chief Complaint  Patient presents with  . Pericardial Effusion    Follow up visit - seen for Dr. Acie Fredrickson    History of Present Illness: Clifford Murillo is a 54 y.o. male who presents today for a post hospital visit. Seen for Dr. Elnora Morrison. I am assuming Dr. Meda Coffee will follow. He does not have a primary care MD.   He presented last week with fever, cough, pneumonia and found to have a moderate to larger pericardial effusion. He did not require any procedure and was placed on colchicine and NSAID. No evidence of tamponade. Was to have repeat study in one week.  Comes in today. Has been home 4 days. Does not have follow up echo scheduled. He is doing very well. He is afebrile. No cough. Not short of breath. No chest pain. He is taking his medicines. He is planning on returning to work next week. Tolerating his medicines - no real GI effects fortunately.    Past Medical History  Diagnosis Date  . CAP (community acquired pneumonia) 09/11/2014  . Pericardial effusion 09/11/2014    [name] 09/11/2014    Past Surgical History  Procedure Laterality Date  . Inguinal hernia repair Bilateral      Medications: Current Outpatient Prescriptions  Medication Sig Dispense Refill  . aspirin 325 MG tablet Take 2 tablets (650 mg total) by mouth every 8 (eight) hours. 90 tablet 1  . colchicine 0.6 MG tablet Take 1 tablet (0.6 mg total) by mouth 2 (two) times daily. 60 tablet 1  . HYDROcodone-homatropine (HYCODAN) 5-1.5 MG/5ML syrup   0  . ibuprofen (ADVIL,MOTRIN) 400 MG tablet Take 1 tablet (400 mg total) by mouth 3 (three) times daily. 90 tablet 1  . pantoprazole (PROTONIX) 40 MG tablet Take 1 tablet (40 mg total) by mouth daily before breakfast. 30 tablet 2   No current facility-administered medications for this  visit.    Allergies: No Known Allergies  Social History: The patient  reports that he has never smoked. He has never used smokeless tobacco. He reports that he does not drink alcohol or use illicit drugs.   Family History: The patient's family history includes Diabetes in his mother.   Review of Systems: Please see the history of present illness.   Otherwise, the review of systems is positive for none.   All other systems are reviewed and negative.   Physical Exam: VS:  BP 126/84 mmHg  Pulse 108  Ht 5\' 3"  (1.6 m)  Wt 140 lb 12.8 oz (63.866 kg)  BMI 24.95 kg/m2 .  BMI Body mass index is 24.95 kg/(m^2).  Wt Readings from Last 3 Encounters:  09/18/14 140 lb 12.8 oz (63.866 kg)  09/13/14 138 lb 4.8 oz (62.732 kg)  09/11/14 147 lb (66.679 kg)    General: Pleasant. Well developed, well nourished and in no acute distress.  HEENT: Normal. Neck: Supple, no JVD, carotid bruits, or masses noted.  Cardiac: Regular rate and rhythm. Rate is a little fast. No murmurs, rubs, or gallops. No edema.  Respiratory:  Lungs are clear to auscultation bilaterally with normal work of breathing.  GI: Soft and nontender.  MS: No deformity or atrophy. Gait and ROM intact. Skin: Warm and dry. Color is normal.  Neuro:  Strength and sensation are intact and no  gross focal deficits noted.  Psych: Alert, appropriate and with normal affect.   LABORATORY DATA:  EKG:  EKG is ordered today.   Lab Results  Component Value Date   WBC 9.8 09/13/2014   HGB 12.1* 09/13/2014   HCT 36.4* 09/13/2014   PLT 594* 09/13/2014   GLUCOSE 94 09/14/2014   CHOL 144 01/14/2014   TRIG 295* 01/14/2014   HDL 54 01/14/2014   LDLCALC 31 01/14/2014   ALT 66* 09/14/2014   AST 35 09/14/2014   NA 137 09/14/2014   K 3.9 09/14/2014   CL 100* 09/14/2014   CREATININE 0.74 09/14/2014   BUN 8 09/14/2014   CO2 27 09/14/2014   TSH 1.814 09/09/2014   PSA 3.77 01/14/2014   INR 1.24 09/12/2014    BNP (last 3  results)  Recent Labs  09/11/14 1458  BNP 129.2*    ProBNP (last 3 results) No results for input(s): PROBNP in the last 8760 hours.   Other Studies Reviewed Today:  Echocardiogram 09/12/2014  LV EF: 60% -  65%  ------------------------------------------------------------------- Indications:   Dyspnea 786.09. Pericardial effusion 423.9.  ------------------------------------------------------------------- History:  PMH: Pneumonia.  ------------------------------------------------------------------- Study Conclusions  - Left ventricle: The cavity size was normal. Wall thickness was normal. Systolic function was normal. The estimated ejection fraction was in the range of 60% to 65%. Features are consistent with a pseudonormal left ventricular filling pattern, with concomitant abnormal relaxation and increased filling pressure (grade 2 diastolic dysfunction). - Pericardium, extracardiac: Moderate to large pericardial effusion surrounds heart with some organization. There is no significant mitral inflow variation, no RV collapse. Clinical correlation indicated. There was a left pleural effusion.         Assessment/Plan: 1. Moderate pericardial effusion with possible viral pericarditis - CTA of chest 09/11/2014 moderately large pericardial effusion, small R and moderate L pleural effusion - Echo 09/12/2014 EF 62-22%, grade 2 diastolic dysfunction, moderate to large pericardial effusion, no RV collapse - CRP 38.5, sed rate 84 - No RV collapse noted on echo to suggest hemodynamically compromising tamponade  - He is now without any symptoms but remains tachycardic. Will need to get his echo rechecked.   2. Sinus tachycardia - likely related to underlying infectious etiology and pericardial effusion  3. Elevated bilirubin - maybe related to veinous congestion, transaminase  improving - hepatitis panel negative, HIV lab nonreactive, UDT negative  - repeating lab today  4. Pleural effusion, L > R  I will get repeat labs today. Will get his echo arranged for later this week. Will need EKG today. He is improved clinically. I will see him back (not able to find any availability with Meda Coffee or Aundra Dubin). Will need guidance about duration of therapy - discussed with Dr. Acie Fredrickson and he has advised at least one month of therapy.  Current medicines are reviewed with the patient today.  The patient does not have concerns regarding medicines other than what has been noted above.  The following changes have been made:  See above.  Labs/ tests ordered today include:   No orders of the defined types were placed in this encounter.     Disposition:   FU with me in 4 weeks.   Patient is agreeable to this plan and will call if any problems develop in the interim.   Signed: Burtis Junes, RN, ANP-C 09/18/2014 4:14 PM  [facility] [address] [place], [place]  [address] Phone: [phone] Fax: [phone]

## 2014-09-18 NOTE — Patient Instructions (Addendum)
We will be checking the following labs today - BMET, HPF, CBC   Medication Instructions:    Continue with your current medicines.     Testing/Procedures To Be Arranged:  Limited echo this week  Follow-Up:   See me in one month    Other Special Instructions:   N/A  Call the [place] office at [phone] if you have any questions, problems or concerns.

## 2014-09-19 LAB — BASIC METABOLIC PANEL
BUN: 8 mg/dL (ref 6–23)
CO2: 28 mEq/L (ref 19–32)
Calcium: 9.2 mg/dL (ref 8.4–10.5)
Chloride: 101 mEq/L (ref 96–112)
Creatinine, Ser: 0.76 mg/dL (ref 0.40–1.50)
GFR: 138.12 mL/min (ref >60.00)
Glucose, Bld: 77 mg/dL (ref 70–99)
Potassium: 4.1 mEq/L (ref 3.5–5.1)
Sodium: 136 mEq/L (ref 135–145)

## 2014-09-19 LAB — CBC
HCT: 37.3 % — ABNORMAL LOW (ref 39.0–52.0)
Hemoglobin: 12.3 g/dL — ABNORMAL LOW (ref 13.0–17.0)
MCHC: 33 g/dL (ref 30.0–36.0)
MCV: 87.3 fl (ref 78.0–100.0)
Platelets: 821 10*3/uL — ABNORMAL HIGH (ref 150.0–400.0)
RBC: 4.28 Mil/uL (ref 4.22–5.81)
RDW: 13.9 % (ref 11.5–15.5)
WBC: 6.9 10*3/uL (ref 4.0–10.5)

## 2014-09-20 ENCOUNTER — Other Ambulatory Visit: Payer: Self-pay | Admitting: *Deleted

## 2014-09-20 ENCOUNTER — Ambulatory Visit (HOSPITAL_COMMUNITY)
Admission: RE | Admit: 2014-09-20 | Discharge: 2014-09-20 | Disposition: A | Discharge status: Home / Self Care | Payer: 59 | Source: Ambulatory Visit | Attending: Cardiology | Admitting: Cardiology

## 2014-09-20 DIAGNOSIS — I3139 Other pericardial effusion (noninflammatory): Secondary | ICD-10-CM

## 2014-09-20 DIAGNOSIS — I319 Disease of pericardium, unspecified: Secondary | ICD-10-CM

## 2014-09-20 DIAGNOSIS — I071 Rheumatic tricuspid insufficiency: Secondary | ICD-10-CM | POA: Diagnosis not present

## 2014-09-20 DIAGNOSIS — I313 Pericardial effusion (noninflammatory): Secondary | ICD-10-CM

## 2014-09-20 DIAGNOSIS — R7989 Other specified abnormal findings of blood chemistry: Secondary | ICD-10-CM

## 2014-09-24 ENCOUNTER — Other Ambulatory Visit (INDEPENDENT_AMBULATORY_CARE_PROVIDER_SITE_OTHER): Payer: 59 | Admitting: *Deleted

## 2014-09-24 ENCOUNTER — Telehealth: Payer: Self-pay | Admitting: Nurse Practitioner

## 2014-09-24 DIAGNOSIS — D473 Essential (hemorrhagic) thrombocythemia: Secondary | ICD-10-CM

## 2014-09-24 DIAGNOSIS — R7989 Other specified abnormal findings of blood chemistry: Secondary | ICD-10-CM

## 2014-09-24 LAB — CBC WITH DIFFERENTIAL/PLATELET
Basophils Absolute: 0 10*3/uL (ref 0.0–0.1)
Basophils Relative: 0.4 % (ref 0.0–3.0)
Eosinophils Absolute: 0.1 10*3/uL (ref 0.0–0.7)
Eosinophils Relative: 1.5 % (ref 0.0–5.0)
HCT: 37.4 % — ABNORMAL LOW (ref 39.0–52.0)
Hemoglobin: 12.6 g/dL — ABNORMAL LOW (ref 13.0–17.0)
Lymphocytes Relative: 15 % (ref 12.0–46.0)
Lymphs Abs: 1 10*3/uL (ref 0.7–4.0)
MCHC: 33.6 g/dL (ref 30.0–36.0)
MCV: 85.1 fl (ref 78.0–100.0)
Monocytes Absolute: 0.6 10*3/uL (ref 0.1–1.0)
Monocytes Relative: 9.5 % (ref 3.0–12.0)
Neutro Abs: 4.8 10*3/uL (ref 1.4–7.7)
Neutrophils Relative %: 73.6 % (ref 43.0–77.0)
Platelets: 499 10*3/uL — ABNORMAL HIGH (ref 150.0–400.0)
RBC: 4.39 Mil/uL (ref 4.22–5.81)
RDW: 14.3 % (ref 11.5–15.5)
WBC: 6.5 10*3/uL (ref 4.0–10.5)

## 2014-09-24 NOTE — Telephone Encounter (Signed)
rtn danielle's call re lab work

## 2014-09-24 NOTE — Telephone Encounter (Signed)
New message  Pt returning [place] phone call.

## 2014-09-26 NOTE — Telephone Encounter (Signed)
Follow up      Pt thinks he is having something done a [facility] today.  Is there something scheduled for him today at the hosp?

## 2014-09-26 NOTE — Telephone Encounter (Signed)
**Note De-Identified Clifford Murillo Obfuscation** The lady that answered the pts phone stated that the pt is on his way to the hospital for a test. I asked her to have the pt call us back if he has any questions or concerns. She verbalized understanding.

## 2014-09-30 ENCOUNTER — Encounter: Payer: Self-pay | Admitting: Cardiology

## 2014-09-30 ENCOUNTER — Ambulatory Visit (INDEPENDENT_AMBULATORY_CARE_PROVIDER_SITE_OTHER): Payer: 59 | Admitting: Cardiology

## 2014-09-30 VITALS — BP 118/62 | HR 106 | Ht 63.0 in | Wt 139.0 lb

## 2014-09-30 DIAGNOSIS — I471 Supraventricular tachycardia: Secondary | ICD-10-CM | POA: Diagnosis not present

## 2014-09-30 DIAGNOSIS — I309 Acute pericarditis, unspecified: Secondary | ICD-10-CM | POA: Diagnosis not present

## 2014-09-30 DIAGNOSIS — R000 Tachycardia, unspecified: Secondary | ICD-10-CM

## 2014-09-30 DIAGNOSIS — I3139 Other pericardial effusion (noninflammatory): Secondary | ICD-10-CM

## 2014-09-30 DIAGNOSIS — I313 Pericardial effusion (noninflammatory): Secondary | ICD-10-CM

## 2014-09-30 DIAGNOSIS — I319 Disease of pericardium, unspecified: Secondary | ICD-10-CM

## 2014-09-30 MED ORDER — COLCHICINE 0.6 MG PO TABS: 0.6000 mg | ORAL_TABLET | Freq: Two times a day (BID) | ORAL | Status: DC

## 2014-09-30 NOTE — Patient Instructions (Signed)
Medication Instructions:  Your physician recommends that you continue on your current medications as directed. Please refer to the Current Medication list given to you today. STOP colchicine in 2 months.   Labwork: NONE  Testing/Procedures: NONE  Follow-Up: Your physician recommends that you schedule a follow-up appointment in: 4 months with Dr. Meda Coffee.   Any Other Special Instructions Will Be Listed Below (If Applicable).

## 2014-09-30 NOTE — Addendum Note (Signed)
Addended by: Dalphine Handing on: 09/30/2014 09:38 AM   Modules accepted: Orders

## 2014-09-30 NOTE — Progress Notes (Signed)
Patient ID: Clifford Murillo, male   DOB: 1961-01-04, 54 y.o.   MRN: [id number]    CARDIOLOGY OFFICE NOTE  Date:  09/30/2014    Clifford Murillo Date of Birth: 1960/06/07 Medical Record #[id number]  PCP:  No PCP Per Patient  Cardiologist:  [place]/Mads Borgmeyer    Chief complain: hospital follow up for pericarditis  History of Present Illness: Clifford Murillo is a 54 y.o. male who presents today for a post hospital visit. Seen for Dr. Elnora Morrison. He presented on 09/11/2014 with fever, cough, pneumonia and found to have a moderate to larger pericardial effusion. He did not require any procedure and was placed on colchicine and NSAID. No evidence of tamponade. Was to have repeat study in one week.  09/30/14 - 2 week follow up, resolved chest pain, no prthopnea, PND, DOE or LE edema, he continues to use colchicine with no side effects.  Repeat echo showed no pericardial effusion.   - CTA of chest 09/11/2014 moderately large pericardial effusion, small R and moderate L pleural effusion - Echo 09/12/2014 EF 95-09%, grade 2 diastolic dysfunction, moderate to large pericardial effusion, no R collapse - CRP 38.5, sed rate 84 - No RV collapse noted on echo to suggest hemodynamically compromising tamponade   Past Medical History  Diagnosis Date  . CAP (community acquired pneumonia) 09/11/2014  . Pericardial effusion 09/11/2014    [name] 09/11/2014    Past Surgical History  Procedure Laterality Date  . Inguinal hernia repair Bilateral      Medications: Current Outpatient Prescriptions  Medication Sig Dispense Refill  . aspirin 325 MG tablet Take 2 tablets (650 mg total) by mouth every 8 (eight) hours. 90 tablet 1  . colchicine 0.6 MG tablet Take 1 tablet (0.6 mg total) by mouth 2 (two) times daily. 60 tablet 1  . HYDROcodone-homatropine (HYCODAN) 5-1.5 MG/5ML syrup   0  . ibuprofen (ADVIL,MOTRIN) 400 MG tablet Take 1 tablet (400 mg total) by mouth 3 (three) times  daily. 90 tablet 1  . pantoprazole (PROTONIX) 40 MG tablet Take 1 tablet (40 mg total) by mouth daily before breakfast. 30 tablet 2   No current facility-administered medications for this visit.    Allergies: No Known Allergies  Social History: The patient  reports that he has never smoked. He has never used smokeless tobacco. He reports that he does not drink alcohol or use illicit drugs.   Family History: The patient's family history includes Diabetes in his mother.   Review of Systems: Please see the history of present illness.   Otherwise, the review of systems is positive for none.   All other systems are reviewed and negative.   Physical Exam: VS:  There were no vitals taken for this visit. Marland Kitchen  BMI There is no weight on file to calculate BMI.  Wt Readings from Last 3 Encounters:  09/18/14 140 lb 12.8 oz (63.866 kg)  09/13/14 138 lb 4.8 oz (62.732 kg)  09/11/14 147 lb (66.679 kg)    General: Pleasant. Well developed, well nourished and in no acute distress.  HEENT: Normal. Neck: Supple, no JVD, carotid bruits, or masses noted.  Cardiac: Regular rate and rhythm. Rate is a little fast. No murmurs, rubs, or gallops. No edema.  Respiratory:  Lungs are clear to auscultation bilaterally with normal work of breathing.  GI: Soft and nontender.  MS: No deformity or atrophy. Gait and ROM intact. Skin: Warm and dry. Color is normal.  Neuro:  Strength and sensation are intact and no gross focal  deficits noted.  Psych: Alert, appropriate and with normal affect.   LABORATORY DATA:  EKG:  EKG is ordered today.   Lab Results  Component Value Date   WBC 6.5 09/24/2014   HGB 12.6* 09/24/2014   HCT 37.4* 09/24/2014   PLT 499.0* 09/24/2014   GLUCOSE 77 09/18/2014   CHOL 144 01/14/2014   TRIG 295* 01/14/2014   HDL 54 01/14/2014   LDLCALC 31 01/14/2014   ALT 66* 09/14/2014   AST 35 09/14/2014   NA 136 09/18/2014   K 4.1 09/18/2014   CL 101 09/18/2014   CREATININE 0.76  09/18/2014   BUN 8 09/18/2014   CO2 28 09/18/2014   TSH 1.814 09/09/2014   PSA 3.77 01/14/2014   INR 1.24 09/12/2014    BNP (last 3 results)  Recent Labs  09/11/14 1458  BNP 129.2*    ProBNP (last 3 results) No results for input(s): PROBNP in the last 8760 hours.   Other Studies Reviewed Today:  Echocardiogram 09/12/2014  LV EF: 60% -  65%  ------------------------------------------------------------------- Indications:   Dyspnea 786.09. Pericardial effusion 423.9.  ------------------------------------------------------------------- History:  PMH: Pneumonia.  ------------------------------------------------------------------- Study Conclusions  - Left ventricle: The cavity size was normal. Wall thickness was normal. Systolic function was normal. The estimated ejection fraction was in the range of 60% to 65%. Features are consistent with a pseudonormal left ventricular filling pattern, with concomitant abnormal relaxation and increased filling pressure (grade 2 diastolic dysfunction). - Pericardium, extracardiac: Moderate to large pericardial effusion surrounds heart with some organization. There is no significant mitral inflow variation, no RV collapse. Clinical correlation indicated. There was a left pleural effusion.       TTE: 09/20/2014 Left ventricle: The cavity size was normal. Systolic function was vigorous. The estimated ejection fraction was in the range of 65% to 70%. Wall motion was normal; there were no regional wall motion abnormalities. Left ventricular diastolic function parameters were normal. - Aortic valve: Trileaflet; normal thickness leaflets. There was no regurgitation. - Aortic root: The aortic root was normal in size. - Mitral valve: Structurally normal valve. - Left atrium: The atrium was normal in size. - Right ventricle: Systolic function was normal. - Right atrium: The atrium was normal in  size. - Tricuspid valve: There was trivial regurgitation. - Pulmonary arteries: Systolic pressure was within the normal range. - Inferior vena cava: The vessel was normal in size. The respirophasic diameter changes were in the normal range (= 50%), consistent with normal central venous pressure. - Pericardium, extracardiac: There was no pericardial effusion.   Assessment/Plan:  1. Acute pericarditis, Moderate pericardial effusion with possible viral pericarditis - repeat echo shows resolution of effusion, also resolved symptoms, we will continue colchicine for another 2 months and follow up in 4 months  2. Sinus tachycardia - resolved  3. Elevated bilirubin - maybe related to veinous congestion, transaminase improving - hepatitis panel negative, HIV lab nonreactive, UDT negative  - we will repeat at the next visit  4. Pleural effusion, L > R  Follow up in 4 months.  Dorothy Spark 09/30/2014

## 2014-10-14 ENCOUNTER — Ambulatory Visit: Payer: 59 | Admitting: Nurse Practitioner

## 2014-11-20 ENCOUNTER — Ambulatory Visit (INDEPENDENT_AMBULATORY_CARE_PROVIDER_SITE_OTHER): Payer: Commercial Managed Care - HMO | Admitting: Emergency Medicine

## 2014-11-20 DIAGNOSIS — L298 Other pruritus: Secondary | ICD-10-CM

## 2014-11-20 LAB — POCT SKIN KOH: SKIN KOH, POC: NEGATIVE

## 2014-11-20 MED ORDER — METHYLPREDNISOLONE ACETATE 80 MG/ML IJ SUSP
80.0000 mg | Freq: Once | INTRAMUSCULAR | Status: AC
  Administered 2014-11-20: 80 mg via INTRAMUSCULAR

## 2014-11-20 NOTE — Progress Notes (Signed)
11/21/2014 at 6:12 AM  Clifford Murillo / DOB: 1960/06/01 / MRN: [id number]  The patient has Pericardial effusion; Periorbital ecchymosis of right eye; Subconjunctival hemorrhage of right eye; Hypokalemia; Thrombocytosis; Hyponatremia; and Elevated LFTs on his problem list.  SUBJECTIVE  Chief complaint: eye swelling of unknown origin  Clifford Murillo is a 54 y.o. male complaining for three days of right sided periorbital xerosis and pruritis.  He denies eye pain and photophobia.  He repots have similar rash on his wrist and elbows.  He has not tried anything for his symptoms and denies a history of asthma.   He  has a past medical history of CAP (community acquired pneumonia) (09/11/2014) and Pericardial effusion (09/11/2014).    Medications reviewed and updated by myself where necessary, and exist elsewhere in the encounter.   Clifford Murillo has No Known Allergies. He  reports that he has never smoked. He has never used smokeless tobacco. He reports that he does not drink alcohol or use illicit drugs. He  reports that he currently engages in sexual activity. The patient  has past surgical history that includes Inguinal hernia repair (Bilateral).  His family history includes Diabetes in his mother.  Review of Systems  Constitutional: Negative for fever.  Respiratory: Negative for cough.   Cardiovascular: Negative for chest pain.  Gastrointestinal: Negative for nausea.  Musculoskeletal: Negative for myalgias.  Skin: Positive for itching and rash.  Neurological: Negative for dizziness and headaches.  Psychiatric/Behavioral: Negative for depression.    OBJECTIVE  His  height is 5\' 3"  (1.6 m) and weight is 143 lb (64.864 kg). His oral temperature is 98.2 F (36.8 C). His blood pressure is 124/76 and his pulse is 96. His respiration is 17 and oxygen saturation is 98%.  The patient's body mass index is 25.34 kg/(m^2).  Physical Exam  Vitals reviewed. Constitutional: He is oriented to person,  place, and time. He appears well-developed and well-nourished. No distress.  HENT:  Head:    Eyes: Right eye exhibits no chemosis, no discharge, no exudate and no hordeolum. No foreign body present in the right eye.  Cardiovascular: Normal rate.   Respiratory: Effort normal.  Musculoskeletal: Normal range of motion.  Neurological: He is alert and oriented to person, place, and time. No cranial nerve deficit.  Skin: Skin is warm and dry. He is not diaphoretic.    Visual Acuity Screening   Right eye Left eye Both eyes  Without correction: 20/30 -2 20/25 -2  20/30 -2  With correction:        Results for orders placed or performed in visit on 11/20/14 (from the past 24 hour(s))  POCT Skin KOH     Status: None   Collection Time: 11/20/14  1:43 PM  Result Value Ref Range   Skin KOH, POC Negative     ASSESSMENT & PLAN  Clifford Murillo was seen today for eye swelling of unknown origin.  Diagnoses and all orders for this visit:  Xerotic eczema: Advised patient to apply Vaseline to affected areas twice daily, and to immediately apply same to affected areas as soon as they appear in the future.  Orders: -     POCT Skin KOH -     methylPREDNISolone acetate (DEPO-MEDROL) injection 80 mg; Inject 1 mL (80 mg total) into the muscle once.    The patient was advised to call or come back to clinic if he does not see an improvement in symptoms, or worsens with the above plan.   Philis Fendt,  MHS, PA-C [facility]  11/21/2014 6:12 AM

## 2015-01-23 ENCOUNTER — Ambulatory Visit (INDEPENDENT_AMBULATORY_CARE_PROVIDER_SITE_OTHER): Payer: Commercial Managed Care - HMO | Admitting: Cardiology

## 2015-01-23 ENCOUNTER — Encounter: Payer: Self-pay | Admitting: Cardiology

## 2015-01-23 VITALS — BP 132/92 | HR 88 | Ht 63.0 in | Wt 151.0 lb

## 2015-01-23 DIAGNOSIS — I471 Supraventricular tachycardia: Secondary | ICD-10-CM | POA: Diagnosis not present

## 2015-01-23 DIAGNOSIS — R000 Tachycardia, unspecified: Secondary | ICD-10-CM

## 2015-01-23 DIAGNOSIS — I3139 Other pericardial effusion (noninflammatory): Secondary | ICD-10-CM

## 2015-01-23 DIAGNOSIS — I309 Acute pericarditis, unspecified: Secondary | ICD-10-CM

## 2015-01-23 DIAGNOSIS — I313 Pericardial effusion (noninflammatory): Secondary | ICD-10-CM

## 2015-01-23 DIAGNOSIS — I319 Disease of pericardium, unspecified: Secondary | ICD-10-CM

## 2015-01-23 NOTE — Patient Instructions (Signed)
Your physician has recommended you make the following change in your medication:    DR NELSON DISCONTINUED YOUR IBUPROFEN AND COLCHICINE, SO YOU SHOULD NO LONGER TAKE THESE MEDICATIONS      Your physician wants you to follow-up in: [place] will receive a reminder letter in the mail two months in advance. If you don't receive a letter, please call our office to schedule the follow-up appointment.

## 2015-01-23 NOTE — Progress Notes (Signed)
Patient ID: Clifford Murillo, male   DOB: 24-Jun-1960, 54 y.o.   MRN: [id number]    CARDIOLOGY OFFICE NOTE  Date:  01/23/2015    Clifford Murillo Date of Birth: [birth year] Medical Record #[id number]  PCP:  No PCP Per Patient  Cardiologist:  [place]/Chrissa Meetze    Chief complain: hospital follow up for pericarditis  History of Present Illness: Clifford Murillo is a 54 y.o. male who presents today for a post hospital visit. Seen for Dr. Elnora Morrison. He presented on 09/11/2014 with fever, cough, pneumonia and found to have a moderate to larger pericardial effusion. He did not require any procedure and was placed on colchicine and NSAID. No evidence of tamponade. Was to have repeat study in one week.  09/30/14 - 2 week follow up, resolved chest pain, no prthopnea, PND, DOE or LE edema, he continues to use colchicine with no side effects.  Repeat echo showed no pericardial effusion.   - CTA of chest 09/11/2014 moderately large pericardial effusion, small R and moderate L pleural effusion - Echo 09/12/2014 EF 26-94%, grade 2 diastolic dysfunction, moderate to large pericardial effusion, no R collapse - CRP 38.5, sed rate 84 - No RV collapse noted on echo to suggest hemodynamically compromising tamponade  01/23/15 - today he feels well, denies CP, DOE, SOB, LE edema, complaint with his meds. No complains.   Past Medical History  Diagnosis Date  . CAP (community acquired pneumonia) 09/11/2014  . Pericardial effusion 09/11/2014    Clifford Murillo 09/11/2014    Past Surgical History  Procedure Laterality Date  . Inguinal hernia repair Bilateral      Medications: Current Outpatient Prescriptions  Medication Sig Dispense Refill  . ibuprofen (ADVIL,MOTRIN) 400 MG tablet Take 1 tablet (400 mg total) by mouth 3 (three) times daily. 90 tablet 1  . colchicine 0.6 MG tablet Take 1 tablet (0.6 mg total) by mouth 2 (two) times daily. 60 tablet 1   No current facility-administered  medications for this visit.    Allergies: No Known Allergies  Social History: The patient  reports that he has never smoked. He has never used smokeless tobacco. He reports that he does not drink alcohol or use illicit drugs.   Family History: The patient's family history includes Diabetes in his mother.   Review of Systems: Please see the history of present illness.   Otherwise, the review of systems is positive for none.   All other systems are reviewed and negative.   Physical Exam: VS:  BP 132/92 mmHg  Pulse 88  Ht 5\' 3"  (1.6 m)  Wt 151 lb (68.493 kg)  BMI 26.76 kg/m2 .  BMI Body mass index is 26.76 kg/(m^2).  Wt Readings from Last 3 Encounters:  01/23/15 151 lb (68.493 kg)  11/20/14 143 lb (64.864 kg)  09/30/14 139 lb (63.05 kg)    General: Pleasant. Well developed, well nourished and in no acute distress.  HEENT: Normal. Neck: Supple, no JVD, carotid bruits, or masses noted.  Cardiac: Regular rate and rhythm. Rate is a little fast. No murmurs, rubs, or gallops. No edema.  Respiratory:  Lungs are clear to auscultation bilaterally with normal work of breathing.  GI: Soft and nontender.  MS: No deformity or atrophy. Gait and ROM intact. Skin: Warm and dry. Color is normal.  Neuro:  Strength and sensation are intact and no gross focal deficits noted.  Psych: Alert, appropriate and with normal affect.   LABORATORY DATA:  EKG:  EKG is ordered today.   Lab Results  Component Value Date   WBC 6.5 09/24/2014   HGB 12.6* 09/24/2014   HCT 37.4* 09/24/2014   PLT 499.0* 09/24/2014   GLUCOSE 77 09/18/2014   CHOL 144 01/14/2014   TRIG 295* 01/14/2014   HDL 54 01/14/2014   LDLCALC 31 01/14/2014   ALT 66* 09/14/2014   AST 35 09/14/2014   NA 136 09/18/2014   K 4.1 09/18/2014   CL 101 09/18/2014   CREATININE 0.76 09/18/2014   BUN 8 09/18/2014   CO2 28 09/18/2014   TSH 1.814 09/09/2014   PSA 3.77 01/14/2014   INR 1.24 09/12/2014    BNP (last 3 results)  Recent  Labs  09/11/14 1458  BNP 129.2*    ProBNP (last 3 results) No results for input(s): PROBNP in the last 8760 hours.   Other Studies Reviewed Today:  Echocardiogram 09/12/2014  LV EF: 60% -  65%  ------------------------------------------------------------------- Indications:   Dyspnea 786.09. Pericardial effusion 423.9.  ------------------------------------------------------------------- History:  PMH: Pneumonia.  ------------------------------------------------------------------- Study Conclusions  - Left ventricle: The cavity size was normal. Wall thickness was normal. Systolic function was normal. The estimated ejection fraction was in the range of 60% to 65%. Features are consistent with a pseudonormal left ventricular filling pattern, with concomitant abnormal relaxation and increased filling pressure (grade 2 diastolic dysfunction). - Pericardium, extracardiac: Moderate to large pericardial effusion surrounds heart with some organization. There is no significant mitral inflow variation, no RV collapse. Clinical correlation indicated. There was a left pleural effusion.       TTE: 09/20/2014 Left ventricle: The cavity size was normal. Systolic function was vigorous. The estimated ejection fraction was in the range of 65% to 70%. Wall motion was normal; there were no regional wall motion abnormalities. Left ventricular diastolic function parameters were normal. - Aortic valve: Trileaflet; normal thickness leaflets. There was no regurgitation. - Aortic root: The aortic root was normal in size. - Mitral valve: Structurally normal valve. - Left atrium: The atrium was normal in size. - Right ventricle: Systolic function was normal. - Right atrium: The atrium was normal in size. - Tricuspid valve: There was trivial regurgitation. - Pulmonary arteries: Systolic pressure was within the normal range. - Inferior vena cava: The vessel  was normal in size. The respirophasic diameter changes were in the normal range (= 50%), consistent with normal central venous pressure. - Pericardium, extracardiac: There was no pericardial effusion.   Assessment/Plan:  1. Acute pericarditis, Moderate pericardial effusion with possible viral pericarditis - repeat echo shows resolution of effusion, also resolved symptoms, we continued colchicine for another 2 months, he is now asymptomatic, we will d/c  2. Sinus tachycardia - resolved  3. Elevated bilirubin - maybe related to veinous congestion, transaminase improving - hepatitis panel negative, HIV lab nonreactive, UDT negative  - we will repeat at the next visit  4. Pleural effusion, L > R  Follow up in 1 year.  Dorothy Spark 01/23/2015

## 2015-08-22 ENCOUNTER — Telehealth: Payer: Self-pay | Admitting: *Deleted

## 2015-08-22 NOTE — Telephone Encounter (Signed)
Called and went over everything for Clifford Murillo.  I have spoek to Tiffany in IR and alisha in sch.  Pt has to be NPO after midnight, can stay on all meds and take his meds that mronign with only a sip of water and gave her the  Address to the place and gave her # [phone] whic is [place] but that would be the only dept open at that time if they have driving issues.  When they go in main entrance they should ask staff member to lead them to radiology dept.  i will check with dr Mike Gip about when to have f/u date.  Wife agreeable to the plan

## 2015-08-25 ENCOUNTER — Encounter: Payer: Self-pay | Admitting: Hematology and Oncology

## 2016-01-15 ENCOUNTER — Encounter: Payer: Self-pay | Admitting: Cardiology

## 2016-01-30 ENCOUNTER — Ambulatory Visit: Payer: Commercial Managed Care - HMO | Admitting: Cardiology

## 2016-02-04 ENCOUNTER — Encounter: Payer: Self-pay | Admitting: Cardiology

## 2017-03-02 NOTE — Telephone Encounter (Signed)
done

## 2017-05-10 ENCOUNTER — Ambulatory Visit (INDEPENDENT_AMBULATORY_CARE_PROVIDER_SITE_OTHER): Payer: Medicare Other | Admitting: Urgent Care

## 2017-05-10 ENCOUNTER — Encounter: Payer: Self-pay | Admitting: Urgent Care

## 2017-05-10 ENCOUNTER — Other Ambulatory Visit: Payer: Self-pay

## 2017-05-10 VITALS — BP 122/82 | HR 91 | Temp 98.1°F | Resp 16 | Ht 63.78 in | Wt 146.0 lb

## 2017-05-10 DIAGNOSIS — Z9109 Other allergy status, other than to drugs and biological substances: Secondary | ICD-10-CM | POA: Diagnosis not present

## 2017-05-10 DIAGNOSIS — B9789 Other viral agents as the cause of diseases classified elsewhere: Secondary | ICD-10-CM

## 2017-05-10 DIAGNOSIS — R0981 Nasal congestion: Secondary | ICD-10-CM | POA: Diagnosis not present

## 2017-05-10 DIAGNOSIS — J069 Acute upper respiratory infection, unspecified: Secondary | ICD-10-CM

## 2017-05-10 MED ORDER — HYDROCODONE-HOMATROPINE 5-1.5 MG/5ML PO SYRP: 5.0000 mL | ORAL_SOLUTION | Freq: Every evening | ORAL | 0 refills | Status: DC | PRN

## 2017-05-10 MED ORDER — AMOXICILLIN 500 MG PO CAPS: 500.0000 mg | ORAL_CAPSULE | Freq: Three times a day (TID) | ORAL | 0 refills | Status: DC

## 2017-05-10 MED ORDER — LORATADINE 10 MG PO TABS: 10.0000 mg | ORAL_TABLET | Freq: Every day | ORAL | 11 refills | Status: DC

## 2017-05-10 MED ORDER — PSEUDOEPHEDRINE HCL ER 120 MG PO TB12: 120.0000 mg | ORAL_TABLET | Freq: Two times a day (BID) | ORAL | 3 refills | Status: DC

## 2017-05-10 MED ORDER — BENZONATATE 100 MG PO CAPS: 100.0000 mg | ORAL_CAPSULE | Freq: Three times a day (TID) | ORAL | 0 refills | Status: DC | PRN

## 2017-05-10 NOTE — Patient Instructions (Addendum)
Viral Respiratory Infection A respiratory infection is an illness that affects part of the respiratory system, such as the lungs, nose, or throat. Most respiratory infections are caused by either viruses or bacteria. A respiratory infection that is caused by a virus is called a viral respiratory infection. Common types of viral respiratory infections include:  A cold.  The flu (influenza).  A respiratory syncytial virus (RSV) infection.  How do I know if I have a viral respiratory infection? Most viral respiratory infections cause:  A stuffy or runny nose.  Yellow or green nasal discharge.  A cough.  Sneezing.  Fatigue.  Achy muscles.  A sore throat.  Sweating or chills.  A fever.  A headache.  How are viral respiratory infections treated? If influenza is diagnosed early, it may be treated with an antiviral medicine that shortens the length of time a person has symptoms. Symptoms of viral respiratory infections may be treated with over-the-counter and prescription medicines, such as:  Expectorants. These make it easier to cough up mucus.  Decongestant nasal sprays.  Health care providers do not prescribe antibiotic medicines for viral infections. This is because antibiotics are designed to kill bacteria. They have no effect on viruses. How do I know if I should stay home from work or school? To avoid exposing others to your respiratory infection, stay home if you have:  A fever.  A persistent cough.  A sore throat.  A runny nose.  Sneezing.  Muscles aches.  Headaches.  Fatigue.  Weakness.  Chills.  Sweating.  Nausea.  Follow these instructions at home:  Rest as much as possible.  Take over-the-counter and prescription medicines only as told by your health care provider.  Drink enough fluid to keep your urine clear or pale yellow. This helps prevent dehydration and helps loosen up mucus.  Gargle with a salt-water mixture 3-4 times per day or  as needed. To make a salt-water mixture, completely dissolve -1 tsp of salt in 1 cup of warm water.  Use nose drops made from salt water to ease congestion and soften raw skin around your nose.  Do not drink alcohol.  Do not use tobacco products, including cigarettes, chewing tobacco, and e-cigarettes. If you need help quitting, ask your health care provider. Contact a health care provider if:  Your symptoms last for 10 days or longer.  Your symptoms get worse over time.  You have a fever.  You have severe sinus pain in your face or forehead.  The glands in your jaw or neck become very swollen. Get help right away if:  You feel pain or pressure in your chest.  You have shortness of breath.  You faint or feel like you will faint.  You have severe and persistent vomiting.  You feel confused or disoriented. This information is not intended to replace advice given to you by your health care provider. Make sure you discuss any questions you have with your health care provider. Document Released: 01/20/2005 Document Revised: 09/18/2015 Document Reviewed: 09/18/2014 Elsevier Interactive Patient Education  2018 [employer].     IF you received an x-ray today, you will receive an invoice from [facility]. Please contact [facility] at [phone] with questions or concerns regarding your invoice.   IF you received labwork today, you will receive an invoice from [facility]. Please contact [facility] at [phone] with questions or concerns regarding your invoice.   Our billing staff will not be able to assist you with questions regarding bills from  these companies.  You will be contacted with the lab results as soon as they are available. The fastest way to get your results is to activate your My Chart account. Instructions are located on the last page of this paperwork. If you have not heard from [place] regarding the results in 2 weeks, please contact this office.

## 2017-05-10 NOTE — Progress Notes (Signed)
    MRN: [id number] DOB: 1960/05/05  Subjective:   Clifford Murillo is a 57 y.o. male presenting for 2 week history of productive cough. Also has sinus congestion, intermittent nasal pain. Had a sore throat initially but feels much better. Has tried otc sinus medications and Robitussin with minimal relief. Has allergies, managed with Claritin. Denies fever, chest pain, shob, wheezing, n/v, abdominal pain. Denies smoking cigarettes. Denies history of asthma.   Clifford Murillo is not currently taking any medications and has No Known Allergies.  Clifford Murillo  has a past medical history of CAP (community acquired pneumonia) (09/11/2014) and Pericardial effusion (09/11/2014). Also  has a past surgical history that includes Inguinal hernia repair (Bilateral).  Objective:   Vitals: BP 122/82   Pulse 91   Temp 98.1 F (36.7 C) (Oral)   Resp 16   Ht 5' 3.78" (1.62 m)   Wt 146 lb (66.2 kg)   SpO2 96%   BMI 25.23 kg/m   Physical Exam  Constitutional: He is oriented to person, place, and time. He appears well-developed and well-nourished.  HENT:  TM's intact bilaterally, no effusions or erythema. Nasal turbinates pink, moist, nasal passages patent. No sinus tenderness. Oropharynx clear, mucous membranes moist.  Eyes: Right eye exhibits no discharge. Left eye exhibits no discharge.  Neck: Normal range of motion. Neck supple.  Cardiovascular: Normal rate, regular rhythm and intact distal pulses. Exam reveals no gallop and no friction rub.  No murmur heard. Pulmonary/Chest: No respiratory distress. He has no wheezes. He has no rales.  Lymphadenopathy:    He has no cervical adenopathy.  Neurological: He is alert and oriented to person, place, and time.  Skin: Skin is warm and dry.  Psychiatric: He has a normal mood and affect.   Assessment and Plan :   Viral URI with cough  Sinus congestion  Environmental allergies  Will manage supportively, cough suppression medications given. Restart Claritin and add  Sudafed for allergies, nasal congestion. Patient given script for amoxicillin for secondary sinusitis in case he has no improvement with supportive care and will rtc thereafter if symptoms persist.  Jaynee Eagles, PA-C [facility] [phone] 05/10/2017  9:58 AM

## 2017-05-17 ENCOUNTER — Ambulatory Visit (INDEPENDENT_AMBULATORY_CARE_PROVIDER_SITE_OTHER): Payer: Medicare Other | Admitting: Urgent Care

## 2017-05-17 VITALS — BP 123/82 | HR 96 | Temp 98.2°F | Resp 16 | Ht 63.0 in | Wt 141.0 lb

## 2017-05-17 DIAGNOSIS — B9789 Other viral agents as the cause of diseases classified elsewhere: Secondary | ICD-10-CM | POA: Diagnosis not present

## 2017-05-17 DIAGNOSIS — R0981 Nasal congestion: Secondary | ICD-10-CM | POA: Diagnosis not present

## 2017-05-17 DIAGNOSIS — J069 Acute upper respiratory infection, unspecified: Secondary | ICD-10-CM

## 2017-05-17 DIAGNOSIS — Z9109 Other allergy status, other than to drugs and biological substances: Secondary | ICD-10-CM | POA: Diagnosis not present

## 2017-05-17 NOTE — Progress Notes (Signed)
    MRN: [id number] DOB: 1960-06-05  Subjective:   Clifford Murillo is a 57 y.o. male presenting for follow up on viral URI with cough. Last OV was 05/10/2017, started with supportive care but ended up having to use amoxicillin. Denies fever, sinus pain, congestion, sore throat, cough, chest pain, shob, wheezing.   Clifford Murillo currently has no medications in their medication list. Also has No Known Allergies.  Clifford Murillo  has a past medical history of CAP (community acquired pneumonia) (09/11/2014) and Pericardial effusion (09/11/2014). Also  has a past surgical history that includes Inguinal hernia repair (Bilateral).  Objective:   Vitals: BP 123/82   Pulse 96   Temp 98.2 F (36.8 C) (Oral)   Resp 16   Ht 5\' 3"  (1.6 m)   Wt 141 lb (64 kg)   SpO2 98%   BMI 24.98 kg/m   Physical Exam  Constitutional: He is oriented to person, place, and time. He appears well-developed and well-nourished.  HENT:  Mouth/Throat: Oropharynx is clear and moist.  Cardiovascular: Normal rate, regular rhythm and intact distal pulses. Exam reveals no gallop and no friction rub.  No murmur heard. Pulmonary/Chest: No respiratory distress. He has no wheezes. He has no rales.  Neurological: He is alert and oriented to person, place, and time.   Assessment and Plan :   Viral URI with cough  Sinus congestion  Environmental allergies  Resolved, maintain Claritin and use Sudafed as needed. Follow up prn.  Jaynee Eagles, PA-C [facility] [phone] 05/17/2017 8:54 AM

## 2017-05-17 NOTE — Patient Instructions (Addendum)
Allergies, Adult An allergy is when your body's defense system (immune system) overreacts to an otherwise harmless substance (allergen) that you breathe in or eat or something that touches your skin. When you come into contact with something that you are allergic to, your immune system produces certain proteins (antibodies). These proteins cause cells to release chemicals (histamines) that trigger the symptoms of an allergic reaction. Allergies often affect the nasal passages (allergic rhinitis), eyes (allergic conjunctivitis), skin (atopic dermatitis), and stomach. Allergies can be mild or severe. Allergies cannot spread from person to person (are not contagious). They can develop at any age and may be outgrown. What increases the risk? You may be at greater risk of allergies if other people in your family have allergies. What are the signs or symptoms? Symptoms depend on what type of allergy you have. They may include:  Runny, stuffy nose.  Sneezing.  Itchy mouth, ears, or throat.  Postnasal drip.  Sore throat.  Itchy, red, watery, or puffy eyes.  Skin rash or hives.  Stomach pain.  Vomiting.  Diarrhea.  Bloating.  Wheezing or coughing.  People with a severe allergy to food, medicine, or an insect bite may have a life-threatening allergic reaction (anaphylaxis). Symptoms of anaphylaxis include:  Hives.  Itching.  Flushed face.  Swollen lips, tongue, or mouth.  Tight or swollen throat.  Chest pain or tightness in the chest.  Trouble breathing or shortness of breath.  Rapid heartbeat.  Dizziness or fainting.  Vomiting.  Diarrhea.  Pain in the abdomen.  How is this diagnosed? This condition is diagnosed based on:  Your symptoms.  Your family and medical history.  A physical exam.  You may need to see a health care provider who specializes in treating allergies (allergist). You may also have tests, including:  Skin tests to see which allergens are  causing your symptoms, such as: ? Skin prick test. In this test, your skin is pricked with a tiny needle and exposed to small amounts of possible allergens to see if your skin reacts. ? Intradermal skin test. In this test, a small amount of allergen is injected under your skin to see if your skin reacts. ? Patch test. In this test, a small amount of allergen is placed on your skin and then your skin is covered with a bandage. Your health care provider will check your skin after a couple of days to see if a rash has developed.  Blood tests.  Challenges tests. In this test, you inhale a small amount of allergen by mouth to see if you have an allergic reaction.  You may also be asked to:  Keep a food diary. A food diary is a record of all the foods and drinks you have in a day and any symptoms you experience.  Practice an elimination diet. An elimination diet involves eliminating specific foods from your diet and then adding them back in one by one to find out if a certain food causes an allergic reaction.  How is this treated? Treatment for allergies depends on your symptoms. Treatment may include:  Cold compresses to soothe itching and swelling.  Eye drops.  Nasal sprays.  Using a saline spray or container (neti pot) to flush out the nose (nasal irrigation). These methods can help clear away mucus and keep the nasal passages moist.  Using a humidifier.  Oral antihistamines or other medicines to block allergic reaction and inflammation.  Skin creams to treat rashes or itching.  Diet changes to   eliminate food allergy triggers.  Repeated exposure to tiny amounts of allergens to build up a tolerance and prevent future allergic reactions (immunotherapy). These include: ? Allergy shots. ? Oral treatment. This involves taking small doses of an allergen under the tongue (sublingual immunotherapy).  Emergency epinephrine injection (auto-injector) in case of an allergic emergency. This is  a self-injectable, pre-measured medicine that must be given within the first few minutes of a serious allergic reaction.  Follow these instructions at home:  Avoid known allergens whenever possible.  If you suffer from airborne allergens, wash out your nose daily. You can do this with a saline spray or a neti pot to flush out your nose (nasal irrigation).  Take over-the-counter and prescription medicines only as told by your health care provider.  Keep all follow-up visits as told by your health care provider. This is important.  If you are at risk of a severe allergic reaction (anaphylaxis), keep your auto-injector with you at all times.  If you have ever had anaphylaxis, wear a medical alert bracelet or necklace that states you have a severe allergy. Contact a health care provider if:  Your symptoms do not improve with treatment. Get help right away if:  You have symptoms of anaphylaxis, such as: ? Swollen mouth, tongue, or throat. ? Pain or tightness in your chest. ? Trouble breathing or shortness of breath. ? Dizziness or fainting. ? Severe abdominal pain, vomiting, or diarrhea. This information is not intended to replace advice given to you by your health care provider. Make sure you discuss any questions you have with your health care provider. Document Released: 07/06/2002 Document Revised: 08/11/2016 Document Reviewed: 10/29/2015 Elsevier Interactive Patient Education  2018 [employer].     IF you received an x-ray today, you will receive an invoice from [facility]. Please contact [facility] at [phone] with questions or concerns regarding your invoice.   IF you received labwork today, you will receive an invoice from [facility]. Please contact [facility] at [phone] with questions or concerns regarding your invoice.   Our billing staff will not be able to assist you with questions regarding bills from these companies.  You will be contacted  with the lab results as soon as they are available. The fastest way to get your results is to activate your My Chart account. Instructions are located on the last page of this paperwork. If you have not heard from [place] regarding the results in 2 weeks, please contact this office.

## 2018-06-23 ENCOUNTER — Ambulatory Visit: Payer: Medicare Other | Admitting: Family Medicine

## 2018-06-28 ENCOUNTER — Other Ambulatory Visit: Payer: Self-pay

## 2018-06-28 ENCOUNTER — Encounter: Payer: Self-pay | Admitting: Family Medicine

## 2018-06-28 ENCOUNTER — Ambulatory Visit (INDEPENDENT_AMBULATORY_CARE_PROVIDER_SITE_OTHER): Payer: 59 | Admitting: Family Medicine

## 2018-06-28 VITALS — BP 135/90 | HR 90 | Temp 98.6°F | Ht 63.0 in | Wt 150.0 lb

## 2018-06-28 DIAGNOSIS — H6121 Impacted cerumen, right ear: Secondary | ICD-10-CM | POA: Diagnosis not present

## 2018-06-28 DIAGNOSIS — R94120 Abnormal auditory function study: Secondary | ICD-10-CM | POA: Diagnosis not present

## 2018-06-28 NOTE — Progress Notes (Signed)
   3/4/202010:05 AM  [name] 1960/10/06, 58 y.o. male [id number]  Chief Complaint  Patient presents with  . Ear Problem    had ears checked at work, was told one of the ears registered "high", not sure which on it was. This test was done last Thursday    HPI:   Patient is a 58 y.o. male who presents today for ear concerns  He had hearing test done last week at work and was told he had an abnormal hearing test, he does not remember which ear was abnormal Works in Biomedical scientist, wears ear plugs He denies any hearing concerns, tinnitus   Fall Risk  05/10/2017  Falls in the past year? No     Depression screen [facility] 2/9 05/10/2017 11/20/2014 09/11/2014  Decreased Interest 0 0 0  Down, Depressed, Hopeless 0 0 0  PHQ - 2 Score 0 0 0    No Known Allergies  Prior to Admission medications   Medication Sig Start Date End Date Taking? Authorizing Provider  [facility] 12 HOUR NASAL DECONGESTANT 120 MG 12 hr tablet TAKE 1 TABLET (120 MG TOTAL) BY MOUTH 2 (TWO) TIMES DAILY. 03/13/18  Yes [provider]    Past Medical History:  Diagnosis Date  . CAP (community acquired pneumonia) 09/11/2014  . Pericardial effusion 09/11/2014   [name] 09/11/2014    Past Surgical History:  Procedure Laterality Date  . INGUINAL HERNIA REPAIR Bilateral     Social History   Tobacco Use  . Smoking status: Never Smoker  . Smokeless tobacco: Never Used  Substance Use Topics  . Alcohol use: No    Family History  Problem Relation Age of Onset  . Diabetes Mother     ROS Per hpi  OBJECTIVE:  Blood pressure 135/90, pulse 90, temperature 98.6 F (37 C), temperature source Oral, height 5\' 3"  (1.6 m), weight 150 lb (68 kg), SpO2 99 %. Body mass index is 26.57 kg/m.   Physical Exam Vitals signs and nursing note reviewed.  HENT:     Head: Normocephalic and atraumatic.     Right Ear: Tympanic membrane, ear canal and external ear normal. There is impacted cerumen (lavaged successfully by  CMA).     Left Ear: Tympanic membrane, ear canal and external ear normal.     ASSESSMENT and PLAN  1. Impacted cerumen of right ear - Ear wax removal - successfully removed by CMA  2. Abnormal hearing screen Referring for formal hearing test.  - Ambulatory referral to Audiology    Return if symptoms worsen or fail to improve.    Rutherford Guys, MD [facility] [place], [place] [address] Ph.  [phone] Fax [phone]

## 2019-03-02 ENCOUNTER — Other Ambulatory Visit: Payer: Self-pay

## 2019-03-02 DIAGNOSIS — Z20822 Contact with and (suspected) exposure to covid-19: Secondary | ICD-10-CM

## 2019-03-03 ENCOUNTER — Telehealth: Payer: Self-pay

## 2019-03-03 NOTE — Telephone Encounter (Signed)
Pt called for covid 19 test results-advised that results are not back.

## 2019-03-04 LAB — NOVEL CORONAVIRUS, NAA: SARS-CoV-2, NAA: DETECTED — AB

## 2019-07-04 ENCOUNTER — Telehealth: Payer: Self-pay | Admitting: *Deleted

## 2019-07-04 NOTE — Telephone Encounter (Signed)
Schedule AWV  Is patient still seeing Dr. Pamella Pert?

## 2019-07-09 ENCOUNTER — Other Ambulatory Visit: Payer: Self-pay

## 2019-07-09 ENCOUNTER — Other Ambulatory Visit: Payer: Self-pay | Admitting: Nurse Practitioner

## 2019-07-09 ENCOUNTER — Ambulatory Visit
Admission: RE | Admit: 2019-07-09 | Discharge: 2019-07-09 | Disposition: A | Discharge status: Home / Self Care | Payer: Self-pay | Source: Ambulatory Visit | Attending: Nurse Practitioner | Admitting: Nurse Practitioner

## 2019-07-09 DIAGNOSIS — S99921A Unspecified injury of right foot, initial encounter: Secondary | ICD-10-CM

## 2021-02-22 ENCOUNTER — Emergency Department (HOSPITAL_COMMUNITY): Payer: Medicare Other

## 2021-02-22 ENCOUNTER — Other Ambulatory Visit: Payer: Self-pay

## 2021-02-22 ENCOUNTER — Inpatient Hospital Stay (HOSPITAL_COMMUNITY)
Admission: EM | Admit: 2021-02-22 | Discharge: 2021-02-27 | DRG: 872 | Disposition: A | Discharge status: Home / Self Care | Payer: Medicare Other | Attending: Family Medicine | Admitting: Family Medicine

## 2021-02-22 ENCOUNTER — Encounter (HOSPITAL_COMMUNITY): Payer: Self-pay | Admitting: Emergency Medicine

## 2021-02-22 DIAGNOSIS — N179 Acute kidney failure, unspecified: Secondary | ICD-10-CM | POA: Diagnosis not present

## 2021-02-22 DIAGNOSIS — R339 Retention of urine, unspecified: Secondary | ICD-10-CM | POA: Diagnosis present

## 2021-02-22 DIAGNOSIS — R338 Other retention of urine: Secondary | ICD-10-CM | POA: Diagnosis not present

## 2021-02-22 DIAGNOSIS — A4159 Other Gram-negative sepsis: Principal | ICD-10-CM | POA: Diagnosis present

## 2021-02-22 DIAGNOSIS — Z87891 Personal history of nicotine dependence: Secondary | ICD-10-CM | POA: Diagnosis not present

## 2021-02-22 DIAGNOSIS — R059 Cough, unspecified: Secondary | ICD-10-CM

## 2021-02-22 DIAGNOSIS — Z833 Family history of diabetes mellitus: Secondary | ICD-10-CM

## 2021-02-22 DIAGNOSIS — N12 Tubulo-interstitial nephritis, not specified as acute or chronic: Secondary | ICD-10-CM | POA: Diagnosis present

## 2021-02-22 DIAGNOSIS — N401 Enlarged prostate with lower urinary tract symptoms: Secondary | ICD-10-CM | POA: Diagnosis not present

## 2021-02-22 DIAGNOSIS — R140 Abdominal distension (gaseous): Secondary | ICD-10-CM | POA: Diagnosis not present

## 2021-02-22 DIAGNOSIS — M545 Low back pain, unspecified: Secondary | ICD-10-CM | POA: Diagnosis present

## 2021-02-22 DIAGNOSIS — Z20822 Contact with and (suspected) exposure to covid-19: Secondary | ICD-10-CM | POA: Diagnosis present

## 2021-02-22 DIAGNOSIS — R000 Tachycardia, unspecified: Secondary | ICD-10-CM | POA: Diagnosis not present

## 2021-02-22 DIAGNOSIS — M419 Scoliosis, unspecified: Secondary | ICD-10-CM | POA: Diagnosis not present

## 2021-02-22 DIAGNOSIS — J90 Pleural effusion, not elsewhere classified: Secondary | ICD-10-CM | POA: Diagnosis not present

## 2021-02-22 DIAGNOSIS — R0682 Tachypnea, not elsewhere classified: Secondary | ICD-10-CM | POA: Diagnosis not present

## 2021-02-22 DIAGNOSIS — M4317 Spondylolisthesis, lumbosacral region: Secondary | ICD-10-CM | POA: Diagnosis not present

## 2021-02-22 DIAGNOSIS — J9811 Atelectasis: Secondary | ICD-10-CM | POA: Diagnosis not present

## 2021-02-22 DIAGNOSIS — D75839 Thrombocytosis, unspecified: Secondary | ICD-10-CM | POA: Diagnosis present

## 2021-02-22 DIAGNOSIS — N400 Enlarged prostate without lower urinary tract symptoms: Secondary | ICD-10-CM | POA: Diagnosis not present

## 2021-02-22 DIAGNOSIS — R6250 Unspecified lack of expected normal physiological development in childhood: Secondary | ICD-10-CM | POA: Diagnosis present

## 2021-02-22 DIAGNOSIS — J811 Chronic pulmonary edema: Secondary | ICD-10-CM | POA: Diagnosis not present

## 2021-02-22 DIAGNOSIS — K5900 Constipation, unspecified: Secondary | ICD-10-CM | POA: Diagnosis present

## 2021-02-22 DIAGNOSIS — R7303 Prediabetes: Secondary | ICD-10-CM | POA: Diagnosis present

## 2021-02-22 LAB — RAPID URINE DRUG SCREEN, HOSP PERFORMED
Amphetamines: POSITIVE — AB
Barbiturates: NOT DETECTED
Benzodiazepines: NOT DETECTED
Cocaine: NOT DETECTED
Opiates: NOT DETECTED
Tetrahydrocannabinol: NOT DETECTED

## 2021-02-22 LAB — CBC WITH DIFFERENTIAL/PLATELET
Abs Immature Granulocytes: 0.12 10*3/uL — ABNORMAL HIGH (ref 0.00–0.07)
Basophils Absolute: 0.1 10*3/uL (ref 0.0–0.1)
Basophils Relative: 0 %
Eosinophils Absolute: 0 10*3/uL (ref 0.0–0.5)
Eosinophils Relative: 0 %
HCT: 44.1 % (ref 39.0–52.0)
Hemoglobin: 14.7 g/dL (ref 13.0–17.0)
Immature Granulocytes: 1 %
Lymphocytes Relative: 8 %
Lymphs Abs: 1.2 10*3/uL (ref 0.7–4.0)
MCH: 29.5 pg (ref 26.0–34.0)
MCHC: 33.3 g/dL (ref 30.0–36.0)
MCV: 88.4 fL (ref 80.0–100.0)
Monocytes Absolute: 2.2 10*3/uL — ABNORMAL HIGH (ref 0.1–1.0)
Monocytes Relative: 15 %
Neutro Abs: 11.1 10*3/uL — ABNORMAL HIGH (ref 1.7–7.7)
Neutrophils Relative %: 76 %
Platelets: 473 10*3/uL — ABNORMAL HIGH (ref 150–400)
RBC: 4.99 MIL/uL (ref 4.22–5.81)
RDW: 12.9 % (ref 11.5–15.5)
WBC: 14.7 10*3/uL — ABNORMAL HIGH (ref 4.0–10.5)
nRBC: 0 % (ref 0.0–0.2)

## 2021-02-22 LAB — URINALYSIS, ROUTINE W REFLEX MICROSCOPIC
Bacteria, UA: NONE SEEN
Bilirubin Urine: NEGATIVE
Glucose, UA: NEGATIVE mg/dL
Ketones, ur: NEGATIVE mg/dL
Nitrite: NEGATIVE
Protein, ur: 100 mg/dL — AB
RBC / HPF: >50 RBC/hpf — ABNORMAL HIGH (ref 0–5)
Specific Gravity, Urine: 1.015 (ref 1.005–1.030)
WBC, UA: >50 WBC/hpf — ABNORMAL HIGH (ref 0–5)
pH: 6 (ref 5.0–8.0)

## 2021-02-22 LAB — BASIC METABOLIC PANEL
Anion gap: 13 (ref 5–15)
BUN: 35 mg/dL — ABNORMAL HIGH (ref 6–20)
CO2: 23 mmol/L (ref 22–32)
Calcium: 9.6 mg/dL (ref 8.9–10.3)
Chloride: 99 mmol/L (ref 98–111)
Creatinine, Ser: 1.77 mg/dL — ABNORMAL HIGH (ref 0.61–1.24)
GFR, Estimated: 44 mL/min — ABNORMAL LOW (ref >60)
Glucose, Bld: 135 mg/dL — ABNORMAL HIGH (ref 70–99)
Potassium: 4 mmol/L (ref 3.5–5.1)
Sodium: 135 mmol/L (ref 135–145)

## 2021-02-22 LAB — TSH: TSH: 0.798 u[IU]/mL (ref 0.350–4.500)

## 2021-02-22 LAB — MAGNESIUM: Magnesium: 2.2 mg/dL (ref 1.7–2.4)

## 2021-02-22 LAB — RESP PANEL BY RT-PCR (FLU A&B, COVID) ARPGX2
Influenza A by PCR: NEGATIVE
Influenza B by PCR: NEGATIVE
SARS Coronavirus 2 by RT PCR: NEGATIVE

## 2021-02-22 MED ORDER — HYDROMORPHONE HCL 1 MG/ML IJ SOLN
1.0000 mg | Freq: Once | INTRAMUSCULAR | Status: AC
  Administered 2021-02-22: 1 mg via INTRAVENOUS
  Filled 2021-02-22: qty 1

## 2021-02-22 MED ORDER — POLYETHYLENE GLYCOL 3350 17 G PO PACK
17.0000 g | PACK | Freq: Two times a day (BID) | ORAL | Status: DC
  Administered 2021-02-23 – 2021-02-24 (×4): 17 g via ORAL
  Filled 2021-02-22 (×5): qty 1

## 2021-02-22 MED ORDER — SODIUM CHLORIDE 0.9 % IV SOLN
1.0000 g | INTRAVENOUS | Status: DC
  Administered 2021-02-23 – 2021-02-24 (×2): 1 g via INTRAVENOUS
  Filled 2021-02-22 (×3): qty 10

## 2021-02-22 MED ORDER — TAMSULOSIN HCL 0.4 MG PO CAPS
0.4000 mg | ORAL_CAPSULE | Freq: Every day | ORAL | Status: DC
  Administered 2021-02-22 – 2021-02-26 (×5): 0.4 mg via ORAL
  Filled 2021-02-22 (×5): qty 1

## 2021-02-22 MED ORDER — SENNA 8.6 MG PO TABS
1.0000 | ORAL_TABLET | Freq: Two times a day (BID) | ORAL | Status: DC
  Administered 2021-02-23 – 2021-02-27 (×7): 8.6 mg via ORAL
  Filled 2021-02-22 (×8): qty 1

## 2021-02-22 MED ORDER — SODIUM CHLORIDE 0.9 % IV SOLN
2.0000 g | Freq: Once | INTRAVENOUS | Status: AC
  Administered 2021-02-22: 2 g via INTRAVENOUS
  Filled 2021-02-22: qty 20

## 2021-02-22 MED ORDER — OXYCODONE HCL 5 MG PO TABS: 5.0000 mg | ORAL_TABLET | Freq: Once | ORAL | Status: DC

## 2021-02-22 MED ORDER — ACETAMINOPHEN 650 MG RE SUPP: 650.0000 mg | Freq: Four times a day (QID) | RECTAL | Status: DC | PRN

## 2021-02-22 MED ORDER — LACTATED RINGERS IV BOLUS
1000.0000 mL | Freq: Once | INTRAVENOUS | Status: AC
  Administered 2021-02-22: 1000 mL via INTRAVENOUS

## 2021-02-22 MED ORDER — ACETAMINOPHEN 325 MG PO TABS
650.0000 mg | ORAL_TABLET | Freq: Four times a day (QID) | ORAL | Status: DC | PRN
  Administered 2021-02-22 – 2021-02-24 (×4): 650 mg via ORAL
  Filled 2021-02-22 (×4): qty 2

## 2021-02-22 MED ORDER — SODIUM CHLORIDE 0.9 % IV BOLUS
500.0000 mL | Freq: Once | INTRAVENOUS | Status: AC
  Administered 2021-02-22: 500 mL via INTRAVENOUS

## 2021-02-22 MED ORDER — SODIUM CHLORIDE 0.9 % IV SOLN: INTRAVENOUS | Status: DC

## 2021-02-22 MED ORDER — HEPARIN SODIUM (PORCINE) 5000 UNIT/ML IJ SOLN
5000.0000 [IU] | Freq: Three times a day (TID) | INTRAMUSCULAR | Status: DC
  Administered 2021-02-22 – 2021-02-24 (×6): 5000 [IU] via SUBCUTANEOUS
  Filled 2021-02-22 (×6): qty 1

## 2021-02-22 NOTE — ED Triage Notes (Addendum)
Patient arrived with EMS from home reports right lower back pain , nasal congestion and occasional dry cough onset this week . HR= 148.

## 2021-02-22 NOTE — ED Notes (Signed)
Clifford Murillo, NT and this RN both tried to straight cath pt, but was unsuccessful do to straight catheter meeting resistance. I notified Dr Doren Custard and Dr Doren Custard inserted an indwelling catheter in pt.

## 2021-02-22 NOTE — ED Notes (Signed)
Dr Doren Custard did a bladder scan on pt and stated pt had 755ml of urine in bladder and pt stated he was unable to urinate.

## 2021-02-22 NOTE — H&P (Addendum)
[facility] Admission History and Physical Service Pager: [phone]  Patient name: Clifford Murillo Medical record number: [id number] Date of birth: 05-13-1960 Age: 60 y.o. Gender: male  Primary Care Provider: Patient, No Pcp Per (Inactive) Consultants: None Code Status: Full Preferred Emergency Contact: Rontrell Moquin (sister) [phone]  Chief Complaint: Lower back pain and Urinary retention   Assessment and Plan: Clifford Murillo is a 60 y.o. male presenting with worsening lower back pain and urinary retention . PMH is significant for childhood Kidney stone.  AKI  Lower back pain  right pyelonephritis likely secondary to BPH Patient presented with 1 week history of decreased urinary output with increased pelvic pressure and 3-day history of worsening lower back pain. He reports slight burning sensation with voiding. On admission patient was tachycardic and tachypneic.  Bladder scan in the ED showed 750 cc of bladder volume and Foley cath inserted. CT of the pelvis revealed nonspecific perinephric stranding in the right kidney concerning for pyelonephritis. Initial labs showed leukocytosis with elevated white blood count of 14.2, thrombocytosis with platelet of 473, GFR 44 and hyperuremia with BUN of 35. His creatinine on admission was 1.77, baseline creatinine is 0.77. Physical exam was unremarkable, no CVA tenderness, abdominal distension, tenderness or BLE edema.  Patient's presentation, lab findings and imaging have strong indication for possible pyelonephritis with post renal AKI. He has no history of BPH, he reports seeing [facility] where he was recommended to get a PSA level but he never got it done. He will need to get PSA level as some point.  -Admit to FPTS, Attending Dr. Thompson Grayer -Is/Os every shift -IV fluid NS at 181ml/hr -Ceftriaxone 1 g daily -Start tamsulosin 0.4mg  daily -Tylenol 650 mg Q6H PRN -Routine vitals per floor routine -Daily  Weight -Follow-up Urine culture -Follow-up Urinalysis -PT/OT Eval -Daily Lab: BMP, CBC -Cardiorespiratory Monitoring -Incentive spirometer -Repeat Bolus NS 500 cc -Consider f/u outpatient Urology  Constipation Patient report no BM in 2 days. His last BM was in form of small beads and he reports having to strain. He has history of constipation. Will start patient on miralax and senna if no improvement. -Ordered Miralax BID  -Ordered senna BID   Tachycardia Likely in setting of Sepsis.  Received 2L LR in ED. ECG No cardiac history and currently asymptomatic.  -Bolus NS 500 cc -TSH -Mag -UDS -Consider ECHO if continued tachycardia -Repeat ECG in am  FEN/GI: Regular diet Prophylaxis: Heparin s/c  Disposition:Med Surge  History of Present Illness:  Clifford Murillo is a 60 y.o. male presenting with worsening lower back pain and urinary distension.  Patient reported that his lower back pain started 3 days ago. He describes it as a sharp shooting pain that is constant and doesn't radiate.Before the back pain he was having difficulty voiding appropriately for a week now. Recently he went to void and passed small stool like " small balls" while straining. He has urinary urgency, however when he tries to void he is only able to get few drops out. He denies having any fever or chill, no recent fall or sick contact. He reported having abdominal discomfort with increased abdominal pressure. Patient endorses noticing a slight enlargement around the pelvic region. Denies having any dysuria, hematuria, leg swelling or tingling/ numbness of his LE.  He had kidney stone as a kid but not recently.   Of note he was seen last year at [facility] and was recommended to get a PSA study.  Patient never got to do  the studies. He denies having any history of prostate issues. He smoker 2 packs of cigarette daily for over 20 years but quit 3 years ago.   ED course: In the ED patient was found to be tachycardiac  with HR of 148.  Bladder ultrasound was ordered which showed over 750cc bladder volume.  Abdominal CT, CMP AND cbc were ordered. Patient received one time dose of Dilaudid 1 mg, Ceftriaxone 1g, LR 1000 bolus x2 and heparin inj.   Review Of Systems: Per HPI with the following additions:   Review of Systems  Constitutional:  Negative for chills and fever.  Respiratory:  Negative for shortness of breath.   Cardiovascular:  Negative for chest pain and leg swelling.  Gastrointestinal:  Positive for constipation. Negative for abdominal pain, blood in stool, diarrhea, nausea, rectal pain and vomiting.  Genitourinary:  Positive for dysuria. Negative for frequency, hematuria, penile discharge and urgency.  Musculoskeletal:  Positive for back pain.    Patient Active Problem List   Diagnosis Date Noted   Pyelonephritis 02/22/2021   Elevated LFTs 09/12/2014   Pericardial effusion 09/11/2014   Periorbital ecchymosis of right eye 09/11/2014   Hypokalemia 09/11/2014   Thrombocytosis 09/11/2014   Hyponatremia 09/11/2014   Subconjunctival hemorrhage of right eye     Past Medical History: Past Medical History:  Diagnosis Date   CAP (community acquired pneumonia) 09/11/2014   Pericardial effusion 09/11/2014   [name] 09/11/2014    Past Surgical History: Past Surgical History:  Procedure Laterality Date   INGUINAL HERNIA REPAIR Bilateral     Social History: Social History   Tobacco Use   Smoking status: Never   Smokeless tobacco: Never  Substance Use Topics   Alcohol use: No   Drug use: No   Additional social history:   Please also refer to relevant sections of EMR.  Family History: Family History  Problem Relation Age of Onset   Diabetes Mother     Allergies and Medications: No Known Allergies No current facility-administered medications on file prior to encounter.   Current Outpatient Medications on File Prior to Encounter  Medication Sig Dispense Refill   pseudoephedrine  (SUDAFED) 30 MG tablet Take 30 mg by mouth every 4 (four) hours as needed for congestion.     fenofibrate (TRICOR) 145 MG tablet Take 145 mg by mouth daily.      Objective: BP 116/73   Pulse (!) 137   Temp (!) 97.5 F (36.4 C) (Oral)   Resp (!) 24   SpO2 92%  Exam: General: Awake, well-appearing, NAD HEENT: Mody, MMM, no conjunctival injection Cardiovascular: Tachycardic, no murmurs, normal S1/S2 Respiratory: CTAB, normal work of breathing on room air, no wheezing or crackles Gastrointestinal: Soft, no distention, no tenderness MSK: Good muscle tone, +2 pedal and radial pulses, no BLE edema Derm: Dry, warm Neuro: AO x3, no focal neuro deficit Psych: Good mood and affect  Labs and Imaging: CBC BMET  Recent Labs  Lab 02/22/21 0705  WBC 14.7*  HGB 14.7  HCT 44.1  PLT 473*   Recent Labs  Lab 02/22/21 0705  NA 135  K 4.0  CL 99  CO2 23  BUN 35*  CREATININE 1.77*  GLUCOSE 135*  CALCIUM 9.6     EKG: HR 143. Sinus Tachycardia no acute ST changes reviewed by me.   CT ABDOMEN PELVIS WO CONTRAST  Result Date: 02/22/2021 CLINICAL DATA:  Abdominal distension.  Right lower back pain EXAM: CT ABDOMEN AND PELVIS WITHOUT CONTRAST TECHNIQUE: Multidetector CT  imaging of the abdomen and pelvis was performed following the standard protocol without IV contrast. COMPARISON:  None. FINDINGS: Lower chest:  Abdominal Hepatobiliary: No focal liver abnormality.No evidence of biliary obstruction or stone. Pancreas: Unremarkable. Spleen: Unremarkable. Adrenals/Urinary Tract: Negative adrenals. No hydronephrosis or stone. Nonspecific perinephric stranding on the right. The bladder is collapsed around a Foley catheter. Stomach/Bowel:  No obstruction. No appendicitis. Vascular/Lymphatic: No acute vascular abnormality. No mass or adenopathy. Reproductive:Enlarged prostate up lifting the bladder base, approximally 4.5 cm craniocaudal. Other: No ascites or pneumoperitoneum. Bilateral inguinal hernia  repair with mesh, scarring likely accounts for fat haziness in this area. Bulging of fat on the left more than right. Musculoskeletal: No acute abnormalities. Spinal degeneration with scoliosis and mild L5-S1 anterolisthesis. IMPRESSION: Perinephric stranding asymmetric to the right. In the setting of right flank pain, correlate for pyelonephritis. Given a Foley catheter is present, this could also be related to recently relieved urinary obstruction. Electronically Signed   By: Jorje Guild M.D.   On: 02/22/2021 10:55     Alen Bleacher, MD 02/22/2021, 7:37 PM PGY-1, [place] Intern pager: [phone], text pages welcome   FPTS Upper-Level Resident Addendum   I have independently interviewed and examined the patient. I have discussed the above with the original author and agree with their documentation.  Please see also any attending notes.    Carollee Leitz MD PGY-3, [facility] 02/22/2021 10:31 PM  FPTS Service pager: [phone] (text pages welcome through [facility])

## 2021-02-22 NOTE — ED Notes (Signed)
Pt denies loss of bowel or bladder or numbness and tingling with lower right back pain

## 2021-02-22 NOTE — ED Notes (Signed)
Joycelyn Rua sister [phone] requesting an update on the patient

## 2021-02-22 NOTE — ED Provider Notes (Signed)
[facility] EMERGENCY DEPARTMENT Provider Note   CSN: [id number] Arrival date & time: 02/22/21  8527     History Chief Complaint  Patient presents with   Back Pain    Clifford Murillo is a 60 y.o. male.   Back Pain Associated symptoms: abdominal pain   Associated symptoms: no chest pain, no dysuria, no fever, no headaches and no numbness   Patient presents for back pain and abdominal pain.  Symptoms have been present for the past 2 days.  He denies any recent trauma.  Area of abdominal pain is midline of lower abdomen.  Area of back pain is lower right side.  He has never experienced pain like this before.  He denies any recent fevers.  Yesterday, he did not eat or drink.  He states that when he drinks a sip of water he feels like he has to go to the bathroom immediately.  He has been urinating small amounts only.  He has had some episodes of watery diarrhea.  He denies any evidence of blood in stool.  Patient does state that he sees doctors at [facility].  During his last visit, he was told his A1c was high.  He was not informed of any other concerns that they had at that time.    Past Medical History:  Diagnosis Date   CAP (community acquired pneumonia) 09/11/2014   Pericardial effusion 09/11/2014   Clifford Murillo 09/11/2014    Patient Active Problem List   Diagnosis Date Noted   Pyelonephritis 02/22/2021   Elevated LFTs 09/12/2014   Pericardial effusion 09/11/2014   Periorbital ecchymosis of right eye 09/11/2014   Hypokalemia 09/11/2014   Thrombocytosis 09/11/2014   Hyponatremia 09/11/2014   Subconjunctival hemorrhage of right eye     Past Surgical History:  Procedure Laterality Date   INGUINAL HERNIA REPAIR Bilateral        Family History  Problem Relation Age of Onset   Diabetes Mother     Social History   Tobacco Use   Smoking status: Never   Smokeless tobacco: Never  Substance Use Topics   Alcohol use: No   Drug use: No    Home  Medications Prior to Admission medications   Medication Sig Start Date End Date Taking? Authorizing Provider  pseudoephedrine (SUDAFED) 30 MG tablet Take 30 mg by mouth every 4 (four) hours as needed for congestion.   Yes [provider]  fenofibrate (TRICOR) 145 MG tablet Take 145 mg by mouth daily. 02/14/21   [provider]    Allergies    Patient has no known allergies.  Review of Systems   Review of Systems  Constitutional:  Positive for appetite change. Negative for chills, diaphoresis, fatigue and fever.  HENT:  Negative for ear pain and sore throat.   Eyes:  Negative for pain and visual disturbance.  Respiratory:  Negative for cough and shortness of breath.   Cardiovascular:  Negative for chest pain and palpitations.  Gastrointestinal:  Positive for abdominal distention, abdominal pain and diarrhea. Negative for blood in stool, nausea and vomiting.  Genitourinary:  Positive for flank pain. Negative for dysuria, hematuria and testicular pain.  Musculoskeletal:  Positive for back pain. Negative for arthralgias, gait problem, joint swelling, myalgias and neck pain.  Skin:  Negative for color change and rash.  Neurological:  Negative for dizziness, seizures, syncope, light-headedness, numbness and headaches.  All other systems reviewed and are negative.  Physical Exam Updated Vital Signs BP 122/70  Pulse (!) 139   Temp (!) 97.5 F (36.4 C) (Oral)   Resp (!) 40   SpO2 97%   Physical Exam Vitals and nursing note reviewed.  Constitutional:      General: He is not in acute distress.    Appearance: Normal appearance. He is well-developed and normal weight. He is not ill-appearing, toxic-appearing or diaphoretic.  HENT:     Head: Normocephalic and atraumatic.     Right Ear: External ear normal.     Left Ear: External ear normal.     Nose: Nose normal.     Mouth/Throat:     Mouth: Mucous membranes are moist.     Pharynx: Oropharynx is clear.  Eyes:      General: No scleral icterus.    Extraocular Movements: Extraocular movements intact.     Conjunctiva/sclera: Conjunctivae normal.  Cardiovascular:     Rate and Rhythm: Regular rhythm. Tachycardia present.     Heart sounds: No murmur heard. Pulmonary:     Effort: Pulmonary effort is normal. No respiratory distress.     Breath sounds: Normal breath sounds. No wheezing or rales.  Chest:     Chest wall: No tenderness.  Abdominal:     General: There is distension.     Palpations: Abdomen is soft.     Tenderness: There is abdominal tenderness. There is no right CVA tenderness or left CVA tenderness.  Musculoskeletal:        General: No tenderness. Normal range of motion.     Cervical back: Normal range of motion and neck supple. No rigidity.     Right lower leg: No edema.     Left lower leg: No edema.  Skin:    General: Skin is warm and dry.     Coloration: Skin is not jaundiced or pale.  Neurological:     General: No focal deficit present.     Mental Status: He is alert and oriented to person, place, and time.     Cranial Nerves: No cranial nerve deficit.     Sensory: No sensory deficit.     Motor: No weakness.  Psychiatric:        Mood and Affect: Mood normal.        Behavior: Behavior normal.        Thought Content: Thought content normal.        Judgment: Judgment normal.    ED Results / Procedures / Treatments   Labs (all labs ordered are listed, but only abnormal results are displayed) Labs Reviewed  URINALYSIS, ROUTINE W REFLEX MICROSCOPIC - Abnormal; Notable for the following components:      Result Value   Color, Urine AMBER (*)    APPearance CLOUDY (*)    Hgb urine dipstick MODERATE (*)    Protein, ur 100 (*)    Leukocytes,Ua LARGE (*)    RBC / HPF >50 (*)    WBC, UA >50 (*)    All other components within normal limits  CBC WITH DIFFERENTIAL/PLATELET - Abnormal; Notable for the following components:   WBC 14.7 (*)    Platelets 473 (*)    Neutro Abs 11.1 (*)     Monocytes Absolute 2.2 (*)    Abs Immature Granulocytes 0.12 (*)    All other components within normal limits  BASIC METABOLIC PANEL - Abnormal; Notable for the following components:   Glucose, Bld 135 (*)    BUN 35 (*)    Creatinine, Ser 1.77 (*)    GFR,  Estimated 44 (*)    All other components within normal limits  RESP PANEL BY RT-PCR (FLU A&B, COVID) ARPGX2  URINE CULTURE  HEPATIC FUNCTION PANEL  LIPASE, BLOOD  HIV ANTIBODY (ROUTINE TESTING W REFLEX)  CBC  BASIC METABOLIC PANEL    EKG EKG Interpretation  Date/Time:  Sunday February 22 2021 06:47:38 EDT Ventricular Rate:  143 PR Interval:  150 QRS Duration: 72 QT Interval:  340 QTC Calculation: 524 R Axis:   100 Text Interpretation: Sinus tachycardia Biatrial enlargement Rightward axis Cannot rule out Anterior infarct , age undetermined Abnormal ECG Confirmed by Godfrey Pick [phone]) on 02/22/2021 8:38:13 AM  Radiology CT ABDOMEN PELVIS WO CONTRAST  Result Date: 02/22/2021 CLINICAL DATA:  Abdominal distension.  Right lower back pain EXAM: CT ABDOMEN AND PELVIS WITHOUT CONTRAST TECHNIQUE: Multidetector CT imaging of the abdomen and pelvis was performed following the standard protocol without IV contrast. COMPARISON:  None. FINDINGS: Lower chest:  Abdominal Hepatobiliary: No focal liver abnormality.No evidence of biliary obstruction or stone. Pancreas: Unremarkable. Spleen: Unremarkable. Adrenals/Urinary Tract: Negative adrenals. No hydronephrosis or stone. Nonspecific perinephric stranding on the right. The bladder is collapsed around a Foley catheter. Stomach/Bowel:  No obstruction. No appendicitis. Vascular/Lymphatic: No acute vascular abnormality. No mass or adenopathy. Reproductive:Enlarged prostate up lifting the bladder base, approximally 4.5 cm craniocaudal. Other: No ascites or pneumoperitoneum. Bilateral inguinal hernia repair with mesh, scarring likely accounts for fat haziness in this area. Bulging of fat on the left more  than right. Musculoskeletal: No acute abnormalities. Spinal degeneration with scoliosis and mild L5-S1 anterolisthesis. IMPRESSION: Perinephric stranding asymmetric to the right. In the setting of right flank pain, correlate for pyelonephritis. Given a Foley catheter is present, this could also be related to recently relieved urinary obstruction. Electronically Signed   By: Jorje Guild M.D.   On: 02/22/2021 10:55    Procedures BLADDER CATHETERIZATION  Date/Time: 02/22/2021 10:02 AM Performed by: Godfrey Pick, MD Authorized by: Godfrey Pick, MD   Consent:    Consent obtained:  Verbal   Consent given by:  Patient   Risks, benefits, and alternatives were discussed: yes     Risks discussed:  Urethral injury, pain and incomplete procedure   Alternatives discussed:  No treatment and delayed treatment Universal protocol:    Procedure explained and questions answered to patient or proxy's satisfaction: yes     Patient identity confirmed:  Verbally with patient Pre-procedure details:    Procedure purpose:  Therapeutic   Preparation: Patient was prepped and draped in usual sterile fashion   Anesthesia:    Anesthesia method:  None Procedure details:    Provider performed due to:  Nurse unable to complete   Catheter insertion:  Indwelling   Catheter type:  Foley   Bladder irrigation: no     Number of attempts:  1   Urine characteristics:  Blood-tinged Post-procedure details:    Procedure completion:  Tolerated well, no immediate complications   Medications Ordered in ED Medications  acetaminophen (TYLENOL) tablet 650 mg (has no administration in time range)    Or  acetaminophen (TYLENOL) suppository 650 mg (has no administration in time range)  heparin injection 5,000 Units (5,000 Units Subcutaneous Given 02/22/21 1657)  cefTRIAXone (ROCEPHIN) 1 g in sodium chloride 0.9 % 100 mL IVPB (has no administration in time range)  tamsulosin (FLOMAX) capsule 0.4 mg (0.4 mg Oral Given 02/22/21  1656)  0.9 %  sodium chloride infusion ( Intravenous New Bag/Given 02/22/21 1701)  lactated ringers bolus 1,000 mL (0  mLs Intravenous Stopped 02/22/21 1122)  HYDROmorphone (DILAUDID) injection 1 mg (1 mg Intravenous Given 02/22/21 0933)  cefTRIAXone (ROCEPHIN) 2 g in sodium chloride 0.9 % 100 mL IVPB (0 g Intravenous Stopped 02/22/21 1412)  lactated ringers bolus 1,000 mL (0 mLs Intravenous Stopped 02/22/21 1653)    ED Course  I have reviewed the triage vital signs and the nursing notes.  Pertinent labs & imaging results that were available during my care of the patient were reviewed by me and considered in my medical decision making (see chart for details).    MDM Rules/Calculators/A&P                          60 year old male presenting for right lower back pain.  On arrival in the ED, tachycardic to 148.  EKG appears to be sinus tachycardia.  Work-up was initiated prior to being bedded in the ED.  Lab results show a creatinine of 1.77.  Baseline is unknown given his previous lab work occurred over 5 years ago, at which time, creatinine was normal.  He does, however, state that he gets his medical care through [facility] and has been seen recently.  He has not been informed of any concerns of his kidney function.  Given this history, concern for AKI.  CBC shows a leukocytosis with neutrophilia.  On exam, patient does have some mild distention to his abdomen.  He states that his abdomen is more distended than normal.  He does have lower midline tenderness in addition to his right lower back pain without tenderness.  He appears uncomfortable and states that he feels like he may have to pee.  He states that when he has been urinating, only small amounts come out.  This raises concern for urinary retention, which could be the cause of his pain and discomfort.  Patient attempted to void but was unable to.  Subsequent bedside ultrasound showed a bladder volume of 750 cc.  Foley catheter was  ordered.  Patient states that he has no history of urinary retention and no known BPH.  Foley catheter was placed.  Urinalysis showed evidence of infection.  CT scan also showed findings consistent with right-sided pyelonephritis.  Ceftriaxone was ordered.  Following Foley catheter placement, additional bolus of IV fluids was ordered.  Patient was admitted to medicine for AKI and pyelonephritis.  Final Clinical Impression(s) / ED Diagnoses Final diagnoses:  Pyelonephritis  AKI (acute kidney injury) [facility])  Urinary retention    Rx / DC Orders ED Discharge Orders     None        Godfrey Pick, MD 02/22/21 1758

## 2021-02-22 NOTE — ED Notes (Signed)
Patient transported to CT 

## 2021-02-23 ENCOUNTER — Encounter (HOSPITAL_COMMUNITY): Payer: Self-pay | Admitting: Student

## 2021-02-23 ENCOUNTER — Inpatient Hospital Stay (HOSPITAL_COMMUNITY): Payer: Medicare Other

## 2021-02-23 DIAGNOSIS — N179 Acute kidney failure, unspecified: Secondary | ICD-10-CM | POA: Diagnosis present

## 2021-02-23 DIAGNOSIS — R000 Tachycardia, unspecified: Secondary | ICD-10-CM

## 2021-02-23 DIAGNOSIS — R0682 Tachypnea, not elsewhere classified: Secondary | ICD-10-CM

## 2021-02-23 DIAGNOSIS — R339 Retention of urine, unspecified: Secondary | ICD-10-CM | POA: Diagnosis present

## 2021-02-23 LAB — BASIC METABOLIC PANEL WITH GFR
Anion gap: 9 (ref 5–15)
BUN: 19 mg/dL (ref 6–20)
CO2: 24 mmol/L (ref 22–32)
Calcium: 8.1 mg/dL — ABNORMAL LOW (ref 8.9–10.3)
Chloride: 100 mmol/L (ref 98–111)
Creatinine, Ser: 1.34 mg/dL — ABNORMAL HIGH (ref 0.61–1.24)
GFR, Estimated: >60 mL/min
Glucose, Bld: 81 mg/dL (ref 70–99)
Potassium: 3.6 mmol/L (ref 3.5–5.1)
Sodium: 133 mmol/L — ABNORMAL LOW (ref 135–145)

## 2021-02-23 LAB — CBC
HCT: 40.1 % (ref 39.0–52.0)
Hemoglobin: 13.2 g/dL (ref 13.0–17.0)
MCH: 29 pg (ref 26.0–34.0)
MCHC: 32.9 g/dL (ref 30.0–36.0)
MCV: 88.1 fL (ref 80.0–100.0)
Platelets: 296 10*3/uL (ref 150–400)
RBC: 4.55 MIL/uL (ref 4.22–5.81)
RDW: 12.9 % (ref 11.5–15.5)
WBC: 12.8 10*3/uL — ABNORMAL HIGH (ref 4.0–10.5)
nRBC: 0 % (ref 0.0–0.2)

## 2021-02-23 LAB — HIV ANTIBODY (ROUTINE TESTING W REFLEX): HIV Screen 4th Generation wRfx: NONREACTIVE

## 2021-02-23 LAB — HEMOGLOBIN A1C
Hgb A1c MFr Bld: 5.9 % — ABNORMAL HIGH (ref 4.8–5.6)
Mean Plasma Glucose: 122.63 mg/dL

## 2021-02-23 MED ORDER — CHLORHEXIDINE GLUCONATE CLOTH 2 % EX PADS
6.0000 | MEDICATED_PAD | Freq: Every day | CUTANEOUS | Status: DC
  Administered 2021-02-23 – 2021-02-27 (×5): 6 via TOPICAL

## 2021-02-23 NOTE — Progress Notes (Signed)
Notified md of patient's current vitals with elevated hr and rr, and pt request for something for his dry hacking cough, md reports she will look over the chart and return my call with any orders. Sbar to be provided to on coming nurse.

## 2021-02-23 NOTE — Progress Notes (Signed)
Family Medicine Teaching Service Daily Progress Note Intern Pager: [phone]  Patient name: Clifford Murillo Medical record number: [id number] Date of birth: June 07, 1960 Age: 60 y.o. Gender: male  Primary Care Provider: Patient, No Pcp Per (Inactive) Consultants: None Code Status: Full  Pt Overview and Major Events to Date:  10/30- admitted   Assessment and Plan:  Clifford Murillo is a 60 yo male presenting with worsening lower back pain and urinary retention. PMH of childhood kidney stone.   AKI  Lower back pain  right pyelonephritis likely secondary to BPH Creatinine this morning is 1.34. elevated but improved from 1.77 on admission. Still have leukocytosis 12.8 which is trending down. He remains tachycardia and tachypneic, met sepsis criteria. Pelvic CT on admission showed right perinephric strain indicative of pyelonephritis. Chest CT showed mild pulmonary congestion. On exam he has no abdominal tenderness of CVA tenderness. No hematuria or dysuria. Will continue treatment with antibiotics and mIVF as needed. -Continue ceftriaxone 1 g daily -Continue tamsulosin 0.5 mg daily -Tylenol 650 mg every 6 hours as needed -I's and O's every shift -Follow up blood cx -Repeat EKG -Continue vital per floor routine -Continue cardiorespiratory monitoring -PT/OT eval -Cardiorespiratory monitoring  Tachycardia Patient has remain tachycardia and tachypneic since admission. HR in the 120s and RR 25-40. His UDS was positive for amphetamine and patient on admission reported taking sudafed.  Magnesium and arterial TSH levels were normal. Could be related to his on going infection process. He remains afebrile. BP is borderline 109/66. Will consider further work up with worsening symptoms or prolonged tachycardia and tachypnia..   -Continue routine vitals    Constipation Patient reported no BM overnight. His last BM was 3 days ago. Denies having any bloody stool on exam has no abdominal distension or  tenderness. Positive bowel sound. - Continue miralax BID -Continue senna BID    FEN/GI: Regular diet  PPx: Heparin s/c Dispo:Home pending clinical improvement .    Subjective:  MR. Clifford Murillo said he slept well through the night. He does have slight headache and recently got tylenol. He denies having any dysuria.   Objective: Temp:  [97.5 F (36.4 C)] 97.5 F (36.4 C) (10/30 0832) Pulse Rate:  [106-140] 123 (10/31 0430) Resp:  [12-45] 31 (10/31 0430) BP: (97-141)/(62-93) 107/71 (10/31 0430) SpO2:  [90 %-100 %] 100 % (10/31 0430) Physical Exam: General: Awake, laying in bed, NAD Cardiovascular: Tachycardic, no murmur Respiratory: CTAB, no wheezing or crackles Abdomen: Soft, no distension, no tenderness Extremities: No BLE edema,   Laboratory: Recent Labs  Lab 02/22/21 0705 02/23/21 0622  WBC 14.7* 12.8*  HGB 14.7 13.2  HCT 44.1 40.1  PLT 473* 296   Recent Labs  Lab 02/22/21 0705 02/23/21 0622  NA 135 133*  K 4.0 3.6  CL 99 100  CO2 23 24  BUN 35* 19  CREATININE 1.77* 1.34*  CALCIUM 9.6 8.1*  GLUCOSE 135* 81      Imaging/Diagnostic Tests: DG CHEST PORT 1 VIEW  Result Date: 02/23/2021 CLINICAL DATA:  Tachypnea.  Nasal congestion.  Occasional dry cough. EXAM: PORTABLE CHEST 1 VIEW COMPARISON:  09/11/2014; chest CT-09/11/2014 FINDINGS: Grossly unchanged cardiac silhouette and mediastinal contours. There is mild elevation/eventration of the right hemidiaphragm. No focal airspace opacities. No pleural effusion or pneumothorax. Mild pulmonary venous congestion without definite evidence of edema. No acute osseous abnormalities. IMPRESSION: Mild pulmonary venous congestion without frank evidence of edema. Further evaluation with a PA and lateral chest radiograph may be obtained as clinically indicated. Electronically Signed  By: Sandi Mariscal M.D.   On: 02/23/2021 08:00     Alen Bleacher, MD 02/23/2021, 7:15 AM PGY-1, [place] Intern  pager: [phone], text pages welcome

## 2021-02-23 NOTE — Hospital Course (Addendum)
Clifford Murillo is a 60 yo male who presented with 1 week of urinary retention and 3 days of worsening lower back pain.  Right Pyelonephritisin the setting of urinary retention due to BPH  postrenal AKI He was found to be tachypneic and tachycardia on arrival to the the ED. His initial bladder scan showed 750 cc and foley was placed which yielded about 1L of urine. Patient reported significant relieve after urine was removed via foley. He had leukocytosis with WBC of 14.7 and Creatinie of 14.7. UA showed >50RBC, >50 WBC. Pelvic CT showed right perinephric strain which is consistent with pyelonephritis and enlarged prostate. He was started on CTX for antibiotics which was transitioned to PO cefdroxil at the end of his hospitalization. Patient also received LR x2 and 500cc bolus. Spoke with on call urology physician on recommendations and f/u. Dr. Milford Cage recommended continuing foley catheter for 10 days to 2 weeks given ED bladder scan of 750 cc. He recommended addition of finasteride and silidosin as it is specific to prostate without as much BP effects. [name]. Due to foley placement PSA was deferred for follow up outpatient.  Possible CAP  tachypnea and cough Patient had cough during hospitalization with tachypnea and cough. During the beginning of his hospitalization he had persistent tachycardi and had a drop in O2 which warranted additioned workup with CTA with showed no PE but small bilateral pleural effusions with PNA not excluded. He was started on azithromycin for 3 days for atypical PNA coverage. He was negative for flu a, flu b and covid. Symptomatic treatment was also performed and patient had no cough or respiratory issue at end of hospitalization on room air.   Tachycardia Patient had tachycardia persistent to 120s-140s a beginning of hospitalization. CTPE r/o CTPE, UDS on admission was positive  for amphetamine but patient said he took sudafed. Magnesium, TSH and electrolytes were normal. EKGs showed sinus tachycardia. Was well hydrated as well. Tachycardia was likely due to acute infection. Improved to 90s at end of hospitalization.  Polydipsia/Polyuria Had been urinating 3-4 L daily. Net down 11.1 L without diuresis. Says he drank 3-5 cups of water a day but per nursing has been asking for water constantly. No electrolyte abnormalities. A1c 5.9 10/31. No glucose in urine on last UA. 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  Constipation Resolved with miralax and senna.  Follow up recommendations: Consider Uosm outpatient Will need foley catheter removal and bladder scan with urology Consider addition of siladosin

## 2021-02-23 NOTE — Progress Notes (Addendum)
FPTS Interim Progress Note  S:Patient seen and evaluated at bedside on nighttime rounds. He reports a productive cough with non-bloody yellow sputum. No nausea or abdominal pain. He is also having palpitations but no chest pain, and he feels better with his mask off and sitting upright.  When asked about drug use, he says he takes no drugs and he took sudafed the other night to help with upper repsiratory symptoms. No ETOH use. Quit smoking recently.  O: BP 103/67   Pulse (!) 130   Temp (!) 97.5 F (36.4 C) (Oral)   Resp (!) 40   SpO2 96%   General: Resting comfortably in bed CV: Normal sinus rhythm, HR in 90s Resp: Clear to auscultation bilaterally anteriorly. Normal work of breathing on room air. Abdomen: Soft, non-tender  A/P: Clifford Murillo is a 60 year old who presented with lower back pain and urinary retention admitted for right pyelonephritis in setting of meeting sepsis criteria.  No concern at this time for ACS, MI as he lacks chest pain and cardiac monitor reveals normal sinus rhythm. Mg returned normal at 2.2 and TSH at 0.798.  Although vitals per chart review reveal tachypnea and tachycardia to 20s-30s and 120s respectfully, he did not appear tachypneic while in the exam room and he was speaking in full sentences without pausing for a breath. Question whether documented vital signs are accurate/ verified.  UDS returned + for amphetamines, likely an incidental positive from Sudafed use.  - Continue management per current day team - Should he develop pain, will pursue cardiac workup (Troponins, Chest X ray, EKG)  Labs and orders reviewed. No new changes.  Orvis Brill, DO 02/23/2021, 12:33 AM PGY-1, [facility] Service pager [phone]

## 2021-02-23 NOTE — Progress Notes (Addendum)
Received page from RN that patient was tachycardic to 140s and increased RR to 30s. Since admission, he has been persistently, and now increasingly, tachycardic.  I evaluated the patient at the bedside. He was generally well-appearing and in no distress- see PEX below. He denies any chest pain, palpitations, dizziness, weakness, visual changes, pain with inspiration. He has a cough that is bothersome to him. No hemoptysis. No nausea or vomiting. He says the room feels hot and was asking for the temperature to be turned down.  He reports constipation and has yet to have a BM but is passing flatus.  I ordered CXR which revealed no changes from CXR earlier today showing low lung volumes limiting assessment with vascular congestion/bronchovascular crowding. Also ordered EKG and CTA chest, both of which are pending.  Objective: Blood pressure 111/62, pulse (!) 130, temperature 99.8 F (37.7 C), temperature source Oral, resp. rate (!) 25, height 5\' 3"  (1.6 m), weight 65.8 kg, SpO2 100 %.  General: Non-toxic appearing, in no distress, slightly diaphoretic, speaking in full sentences Resp: RR 20s. Lungs clear anteriorly Abdomen: Soft, non-tender, slightly distended Extremities: Well-perfused, distal pulses palpable Skin: Warm, moist  A/P Given persistent tachycardia and cough, CXR ordered which showed no evidence of focal consolidation or pneumonia. CTA ordered to rule out PE, which is pending, but less likely given he is not hypoxic, denies any pain or shortness of breath. Unlikely this is acute MI as he denies any chest pain, and EKG shows sinus tachycardia with no acute ST or T wave changes and is unchanged from prior.  Tachycardia could also be a sign of continued sepsis secondary to pyelonephritis as he is not yet 48 hours into antibiotic treatment. He is making good urine output with 2628mL from this morning. WBC trending downward from labs this morning. No other signs of worsening infection. -  Monitor VS closely - Follow up on CTA - Pending blood culture x2 - Pending urine culture - If chest pain, will order troponins and/or other cardiac workup - Continue IV CTX (day  - Continue bowel regimen   Orvis Brill, DO 02/23/2021 8:55pm [place] Intern pager [phone]

## 2021-02-23 NOTE — ED Notes (Signed)
Stuck  patient x2 no blood return notified nurse MATT RN

## 2021-02-24 ENCOUNTER — Inpatient Hospital Stay (HOSPITAL_COMMUNITY): Payer: Medicare Other

## 2021-02-24 LAB — BASIC METABOLIC PANEL
Anion gap: 10 (ref 5–15)
BUN: 11 mg/dL (ref 6–20)
CO2: 22 mmol/L (ref 22–32)
Calcium: 8.1 mg/dL — ABNORMAL LOW (ref 8.9–10.3)
Chloride: 103 mmol/L (ref 98–111)
Creatinine, Ser: 1.15 mg/dL (ref 0.61–1.24)
GFR, Estimated: >60 mL/min (ref >60)
Glucose, Bld: 106 mg/dL — ABNORMAL HIGH (ref 70–99)
Potassium: 3.4 mmol/L — ABNORMAL LOW (ref 3.5–5.1)
Sodium: 135 mmol/L (ref 135–145)

## 2021-02-24 LAB — CBC
HCT: 38.2 % — ABNORMAL LOW (ref 39.0–52.0)
Hemoglobin: 12.6 g/dL — ABNORMAL LOW (ref 13.0–17.0)
MCH: 29 pg (ref 26.0–34.0)
MCHC: 33 g/dL (ref 30.0–36.0)
MCV: 88 fL (ref 80.0–100.0)
Platelets: 291 10*3/uL (ref 150–400)
RBC: 4.34 MIL/uL (ref 4.22–5.81)
RDW: 12.7 % (ref 11.5–15.5)
WBC: 12 10*3/uL — ABNORMAL HIGH (ref 4.0–10.5)
nRBC: 0 % (ref 0.0–0.2)

## 2021-02-24 MED ORDER — BENZONATATE 100 MG PO CAPS: 200.0000 mg | ORAL_CAPSULE | Freq: Three times a day (TID) | ORAL | Status: DC | PRN

## 2021-02-24 MED ORDER — GUAIFENESIN 100 MG/5ML PO LIQD: 10.0000 mL | ORAL | Status: DC | PRN

## 2021-02-24 MED ORDER — BENZONATATE 100 MG PO CAPS
100.0000 mg | ORAL_CAPSULE | Freq: Two times a day (BID) | ORAL | Status: DC | PRN
  Administered 2021-02-24: 100 mg via ORAL
  Filled 2021-02-24: qty 1

## 2021-02-24 MED ORDER — POTASSIUM CHLORIDE CRYS ER 20 MEQ PO TBCR
40.0000 meq | EXTENDED_RELEASE_TABLET | Freq: Once | ORAL | Status: AC
  Administered 2021-02-24: 40 meq via ORAL
  Filled 2021-02-24: qty 2

## 2021-02-24 MED ORDER — ENOXAPARIN SODIUM 40 MG/0.4ML IJ SOSY
40.0000 mg | PREFILLED_SYRINGE | INTRAMUSCULAR | Status: DC
  Administered 2021-02-24 – 2021-02-27 (×4): 40 mg via SUBCUTANEOUS
  Filled 2021-02-24 (×4): qty 0.4

## 2021-02-24 MED ORDER — IOHEXOL 350 MG/ML SOLN
55.0000 mL | Freq: Once | INTRAVENOUS | Status: AC | PRN
  Administered 2021-02-24: 55 mL via INTRAVENOUS

## 2021-02-24 MED ORDER — AZITHROMYCIN 500 MG PO TABS
500.0000 mg | ORAL_TABLET | Freq: Every day | ORAL | Status: AC
Start: 2021-02-24 — End: 2021-02-26
  Administered 2021-02-24 – 2021-02-26 (×3): 500 mg via ORAL
  Filled 2021-02-24 (×3): qty 1

## 2021-02-24 NOTE — Progress Notes (Signed)
   02/23/21 1937  Assess: MEWS Score  Temp 99.8 F (37.7 C)  BP 121/70  Pulse Rate (!) 137  Resp (!) 28  O2 Device Room Air  Assess: MEWS Score  MEWS Temp 0  MEWS Systolic 0  MEWS Pulse 3  MEWS RR 2  MEWS LOC 0  MEWS Score 5  MEWS Score Color Red  Assess: if the MEWS score is Yellow or Red  Were vital signs taken at a resting state? Yes  Focused Assessment No change from prior assessment  Early Detection of Sepsis Score *See Row Information* Low  MEWS guidelines implemented *See Row Information* Yes  Treat  Pain Scale 0-10  Pain Score 3  Pain Location Abdomen  Pain Orientation Right  Pain Descriptors / Indicators Sharp  Pain Intervention(s) Emotional support  Take Vital Signs  Increase Vital Sign Frequency  Red: Q 1hr X 4 then Q 4hr X 4, if remains red, continue Q 4hrs  Escalate  MEWS: Escalate Red: discuss with charge nurse/RN and provider, consider discussing with RRT  Notify: Charge Nurse/RN  Name of Charge Nurse/RN Notified April  Date Charge Nurse/RN Notified 02/23/21  Time Charge Nurse/RN Notified 1937  Notify: Provider  Provider Name/Title Dameron  Date Provider Notified 02/23/21  Time Provider Notified 2020  Notification Type Face-to-face  Document  Patient Outcome Other (Comment) (physician came to bedside to evaluate pt)  Progress note created (see row info) Yes

## 2021-02-24 NOTE — Progress Notes (Signed)
Chaplain introduced spiritual care and offered support during this hospitalization. Chaplain also provided education regarding advance directives. Pt declined extensive education reporting that it was his sister who really wanted the information and she is not present at this time. Clifford Murillo said he would invite his sister to review the booklet chaplain left at the bedside and contact his nurse if they would like chaplain follow up.  Please page as further needs arise.  Donald Prose. Elyn Peers, M.Div. [facility] Chaplain Pager [phone] Office [phone]

## 2021-02-24 NOTE — Progress Notes (Addendum)
Family Medicine Teaching Service Daily Progress Note Intern Pager: [phone]  Patient name: Clifford Murillo Medical record number: [id number] Date of birth: [birth year] Age: 60 y.o. Gender: male  Primary Care Provider: Patient, No Pcp Per (Inactive) Consultants: None Code Status: Full  Pt Overview and Major Events to Date:  10/30 admitted  Assessment and Plan:  Clifford Murillo is a 60 year old male admitted for decreased UOP, hesitancy and worseing back pain for 3 days found to have pyelonephritis with AKI and acute urnary retention. PMH significant for prediabetes.   Tachypnea and Cough  Overnight was tachypneic to the high 20s. Cough is present and says he has yellow colored sputum. Additional workup done last night due to clinical worsening and intermittent 2L oxygen requirement with sats to 88% per nursing. No oxygen on while examining today saturating well well. CXR showed low lung volumes and congestion. CTPE showed no evidence of PE with small bilateral pleural effusions with partial compressive atelectasis of the lower lobes. Pneumonia is not excluded. Negative for Flu A, Flu B, Covid.  -add azithromycin 500 mg for 3 days for atypical coverage -add incentive spirometry -robitussin 10 mL q4h prn  Pyelonephritis in Setting of Urinary Retention due to BPH/ Post renal AKI Patient this morning felt fine. Abdomen today was mildly distended but soft and no pain when palpating. Says feels better after having BM today. Creatinine is improved to 1.15 from 1.34 yesterday. WBC is down-trending at 12 from 12.8. UOP 4.7 L over last 24 hours with catheter in place. No documented fever in hospital. Continues to be tachycardic and tachypneic on charts. MRSA nasal swab negative, blood cultures no growth <24h.  Urine culture grew out greater than 100,000 colonies of gram-negative rods awaiting speciation. -consider void trial tomorrow -IV NS 50 mL/hr d/c, encourage oral intak -ceftriaxone IV 1g daily (day  2) -tylenol 650 mg q6h for pain and fever greater than 101 -f/u urine culture speciation -BMP,CBC in AM  Tachycardia EKG last night showed sinus tachycardia but was otherwise normal.Tachycardic to 120s-140s overnight and yesterday. Potassium of 3.4 this AM. No fever. Denies CP but does say his heart feels like it beats fast sometimes and may skip a beat every now and again. Could be related to atypical pneumonia with insufficient coverage of previously with only ceftriaxone for his pyelonephritis. UDS was positive for amphetamine on admission which she relates to Sudafed. Patient appears well-hydrated on examination unlikely to be related to dehydration.  Magnesium and TSH which were previously normal. CTPE negative, unlikely to be related to pulmonary embolism.  -monitor HR and clinical symptoms see if there is any improvement with azithromycin -potassium repleted 40 mg tablet this AM  Constipation, resolving BM this morning. Pain resolved after BM. Abdomen feels mildly distended but no pain on palpation. -Miralax 17 g BID -Senna 8.6 mg BID  FEN/GI: Regular diet PPx: Switch to lovenox today Dispo:Home pending clinical improvement .  Subjective:  No complaints from patient today. Had BM in morning and feels better.   Objective: Temp:  [98.1 F (36.7 C)-100.3 F (37.9 C)] 100.3 F (37.9 C) (11/01 1232) Pulse Rate:  [116-140] 131 (11/01 1232) Resp:  [20-37] 20 (11/01 0803) BP: (98-132)/(59-80) 130/78 (11/01 1232) SpO2:  [88 %-100 %] 97 % (11/01 1232) Weight:  [67 kg] 67 kg (11/01 0500) Physical Exam: General: NAD, non toxic Cardiovascular: RRR no m/r/g Respiratory: CTAB no w/r/c, no retractions, no iWOB, speaking full sentences on RA Abdomen: Nontender to palpation, mildly distended, normoactive BS  Extremities: No LE edema  Laboratory: Recent Labs  Lab 02/22/21 0705 02/23/21 0622 02/24/21 0111  WBC 14.7* 12.8* 12.0*  HGB 14.7 13.2 12.6*  HCT 44.1 40.1 38.2*  PLT 473* 296  291   Recent Labs  Lab 02/22/21 0705 02/23/21 0622 02/24/21 0111  NA 135 133* 135  K 4.0 3.6 3.4*  CL 99 100 103  CO2 23 24 22   BUN 35* 19 11  CREATININE 1.77* 1.34* 1.15  CALCIUM 9.6 8.1* 8.1*  GLUCOSE 135* 81 106*    Imaging/Diagnostic Tests: CT Angio Chest Pulmonary Embolism (PE) W or WO Contrast  Result Date: 02/24/2021 CLINICAL DATA:  Concern for pulmonary embolism. EXAM: CT ANGIOGRAPHY CHEST WITH CONTRAST TECHNIQUE: Multidetector CT imaging of the chest was performed using the standard protocol during bolus administration of intravenous contrast. Multiplanar CT image reconstructions and MIPs were obtained to evaluate the vascular anatomy. CONTRAST:  68mL OMNIPAQUE IOHEXOL 350 MG/ML SOLN COMPARISON:  Chest CT dated 09/11/2014. FINDINGS: Cardiovascular: There is no cardiomegaly or pericardial effusion. The thoracic aorta is unremarkable. The origins of the great vessels of the aortic arch appear patent. No pulmonary artery embolus identified. Mediastinum/Nodes: There is no hilar or mediastinal adenopathy. The esophagus is grossly unremarkable. No mediastinal fluid collection. Lungs/Pleura: Small bilateral pleural effusions. There is partial compressive atelectasis of the lower lobes. Pneumonia is not excluded clinical correlation is recommended. No pneumothorax. The central airways are patent. Upper Abdomen: No acute abnormality. Musculoskeletal: No chest wall abnormality. No acute or significant osseous findings. Review of the MIP images confirms the above findings. IMPRESSION: 1. No CT evidence of pulmonary embolism. 2. Small bilateral pleural effusions with partial compressive atelectasis of the lower lobes. Pneumonia is not excluded. Electronically Signed   By: Anner Crete M.D.   On: 02/24/2021 00:53   DG CHEST PORT 1 VIEW  Result Date: 02/23/2021 CLINICAL DATA:  Tachycardia and cough. EXAM: PORTABLE CHEST 1 VIEW COMPARISON:  Radiograph earlier today. FINDINGS: Low lung volumes  limit assessment. Stable heart size and mediastinal contours. Similar vascular congestion/bronchovascular crowding. No focal airspace disease, large pleural effusion or pneumothorax. No acute osseous abnormalities are seen. IMPRESSION: Low lung volumes limit assessment. Similar vascular congestion/bronchovascular crowding from earlier today allowing for lower lung volumes. Electronically Signed   By: Keith Rake M.D.   On: 02/23/2021 19:56     Gerrit Heck, MD 02/24/2021, 1:28 PM PGY-1, [place] Intern pager: [phone], text pages welcome

## 2021-02-24 NOTE — Progress Notes (Addendum)
FPTS Interim Progress Note  S:Evaluated patient at bedside with Dr. Susa Simmonds after completion of CTA which revealed "No CT evidence of PE. Small bilateral pleural effusions with partial compressive atelectasis of the lower lobes. Pneumonia is not excluded." RN noted that patient had SpO2 to 88%, and was placed on 2L [place].  Patient continues to have cough, tachycardia and tachypnea. No other complaints. He was resting comfortably when we entered the room and awakened for exam. We removed [place] in the room, with SpO2 dropping no lower than 92%, although patient states he feels more comfortable with oxygen supplementation. We took an oral temperature in the room which was 99.6 F.  O: BP 112/74   Pulse (!) 121   Temp 99.1 F (37.3 C) (Axillary)   Resp (!) 24   Ht 5\' 3"  (1.6 m)   Wt 65.8 kg   SpO2 96%   BMI 25.69 kg/m   General: Resting in bed laying down, non-toxic appearing, in no distress CV: Tachycardic to 120 bpm, no murmurs Resp: Mildly tachypnea to RR 20, dry cough. Mild crackles heard in right lung fields. No wheezing.  A/P: Pt receiving IV CTX for pyelonephritis, with continued sepsis. Suspect that he may also have CAP, given continued tachycardia/tachypnea/increased work of breathing and CTA findings. Will defer to day team as to whether they would like to add additional antibiotic coverage for atypical pathogens. - Robitussin 10mg  syrup q4h for cough, loosen phlegm - IV CTX - Tylenol 650mg  q6h for pain, fever  >101 Nelida Meuse, DO 02/24/2021, 1:53 AM PGY-1, [facility] pager [phone]

## 2021-02-24 NOTE — Progress Notes (Signed)
Provider aware of vitals, patient remains yellow mews since last night r/t heart rate. See previous note from provider.   02/24/21 0803  Vitals  Temp 98.9 F (37.2 C)  Temp Source Oral  BP 126/77  MAP (mmHg) 89  BP Location Left Arm  BP Method Automatic  Patient Position (if appropriate) Lying  Pulse Rate (!) 130  Pulse Rate Source Monitor  Resp 20  Level of Consciousness  Level of Consciousness Alert  MEWS COLOR  MEWS Score Color Yellow  Oxygen Therapy  SpO2 94 %  O2 Device Room Air  MEWS Score  MEWS Temp 0  MEWS Systolic 0  MEWS Pulse 3  MEWS RR 0  MEWS LOC 0  MEWS Score 3

## 2021-02-25 LAB — CBC
HCT: 36.9 % — ABNORMAL LOW (ref 39.0–52.0)
Hemoglobin: 12.6 g/dL — ABNORMAL LOW (ref 13.0–17.0)
MCH: 29.4 pg (ref 26.0–34.0)
MCHC: 34.1 g/dL (ref 30.0–36.0)
MCV: 86.2 fL (ref 80.0–100.0)
Platelets: 313 10*3/uL (ref 150–400)
RBC: 4.28 MIL/uL (ref 4.22–5.81)
RDW: 13 % (ref 11.5–15.5)
WBC: 11.4 10*3/uL — ABNORMAL HIGH (ref 4.0–10.5)
nRBC: 0 % (ref 0.0–0.2)

## 2021-02-25 LAB — BASIC METABOLIC PANEL
Anion gap: 9 (ref 5–15)
BUN: 10 mg/dL (ref 6–20)
CO2: 22 mmol/L (ref 22–32)
Calcium: 8.2 mg/dL — ABNORMAL LOW (ref 8.9–10.3)
Chloride: 101 mmol/L (ref 98–111)
Creatinine, Ser: 0.93 mg/dL (ref 0.61–1.24)
GFR, Estimated: >60 mL/min (ref >60)
Glucose, Bld: 100 mg/dL — ABNORMAL HIGH (ref 70–99)
Potassium: 3.3 mmol/L — ABNORMAL LOW (ref 3.5–5.1)
Sodium: 132 mmol/L — ABNORMAL LOW (ref 135–145)

## 2021-02-25 LAB — MAGNESIUM: Magnesium: 2.3 mg/dL (ref 1.7–2.4)

## 2021-02-25 MED ORDER — SODIUM CHLORIDE 0.9 % IV SOLN
2.0000 g | INTRAVENOUS | Status: DC
  Administered 2021-02-25: 2 g via INTRAVENOUS
  Filled 2021-02-25: qty 20

## 2021-02-25 MED ORDER — POLYETHYLENE GLYCOL 3350 17 G PO PACK
17.0000 g | PACK | Freq: Every day | ORAL | Status: DC | PRN
  Filled 2021-02-25: qty 1

## 2021-02-25 MED ORDER — POTASSIUM CHLORIDE CRYS ER 20 MEQ PO TBCR
40.0000 meq | EXTENDED_RELEASE_TABLET | Freq: Once | ORAL | Status: AC
  Administered 2021-02-25: 40 meq via ORAL
  Filled 2021-02-25: qty 2

## 2021-02-25 NOTE — Progress Notes (Signed)
FPTS Interim Progress Note  S:Patient seen at bedside on nighttime rounds. Sleeping comfortably. Rounded with primary night RN who spoke extensively with patient's family that said "family would  like to know why is the patient's HR elevated and nothing is being done about it.   Patient is developmentally delayed." and had many questions regarding medical treatment. Greatly appreciate RN's discussion with family.  O: BP 126/82 (BP Location: Left Arm)   Pulse (!) 102   Temp 98.3 F (36.8 C) (Oral)   Resp (!) 24   Ht 5\' 3"  (1.6 m)   Wt 67 kg   SpO2 95%   BMI 26.17 kg/m   General: Sleeping comfortably Resp: Normal chest rise and fall  A/P: - Continue current management per day team - Added tessalon capsules for cough - Will have day team call patient's family in morning for updates  Labs and orders reviewed. No new orders. VSS- noticeable improvement in tachycardia that is decreased from 130s-140s to low 100s.  Orvis Brill, DO 02/25/2021, 1:28 AM PGY-1, [place] [facility] Service pager [phone]

## 2021-02-25 NOTE — Progress Notes (Signed)
Family Medicine Teaching Service Daily Progress Note Intern Pager: [phone]  Patient name: Clifford Murillo Medical record number: [id number] Date of birth: 1960/07/18 Age: 60 y.o. Gender: male  Primary Care Provider: Patient, No Pcp Per (Inactive) Consultants: None Code Status: Full  Pt Overview and Major Events to Date:  10/30 Admitted  Assessment and Plan:  Clifford Murillo is a 60 year old male admitted for decreased urine output, hesitancy and worsening back pain found to have pyelonephritis with postrenal AKI and urinary retention. Has also been having tachypnea, tachycardia and cough since being in the hospital with possible atypical pneumonia.  Past medical history significant for prediabetes  Pyelonephritis in setting of urinary retention due to BPH  post renal AKI Patient this morning felt improved.  Abdomen was non-tender to palpation. creatinine today is improved to 0.93 from 1.15 yesterday.  White blood cell count is down to 11.4 from 12.  No documented fevers in hospital. Urine output is 5.2 L in last 24 hours. Urine culture showed greater than 100,000 colonies of Klebsiella pneumonia with susceptibilities pending. -Void trial today with bladder scans q6h -Encouraging oral intake -Ceftriaxone IV  daily day 3 -Tylenol 650 mg every 6 hours for pain and fever greater than 101 -Follow-up urine culture susceptibilities -Daily BMP, CBC in a.m.  Tachycardia EKG obtained last night showed sinus tachycardia with no abnormalities.  Charted vitals last night were low tachycardia 107-108.  In room patient's denied chest pain. Potassium of 3.3. -Added continuous heart rate monitoring -Potassium repleted with 40 mEq tablet this morning  Possible CAP  Tachypnea and cough Respiratory rate was in 20s.  Cough today is much improved in room lungs sounded clear on room air saturating well. -azithromycin day 2/3 -incentive spirometry -tessalon 100 mg BID  Constipation, resolved  2 bowel  movements charted, feels he is regular-loose BMs -Change MiraLAX to as needed today -Senna 8.6 mg twice daily  FEN/GI: Regular Diet PPx: lovenox 40 mg Dispo:Home pending clinical improvement .   Subjective:  No complaints today  Objective: Temp:  [98.3 F (36.8 C)-100.3 F (37.9 C)] 98.9 F (37.2 C) (11/02 0410) Pulse Rate:  [102-132] 107 (11/02 0410) Resp:  [16-26] 20 (11/02 0410) BP: (110-132)/(59-102) 132/76 (11/02 0410) SpO2:  [94 %-97 %] 94 % (11/02 0410) Weight:  [64.8 kg] 64.8 kg (11/02 0410) Physical Exam: General: NAD Cardiovascular: RRR no m/r/g Respiratory: CTAB Abdomen: Non-distended non-tender to palpation Extremities: No swelling  Laboratory: Recent Labs  Lab 02/23/21 0622 02/24/21 0111 02/25/21 0249  WBC 12.8* 12.0* 11.4*  HGB 13.2 12.6* 12.6*  HCT 40.1 38.2* 36.9*  PLT 296 291 313   Recent Labs  Lab 02/23/21 0622 02/24/21 0111 02/25/21 0249  NA 133* 135 132*  K 3.6 3.4* 3.3*  CL 100 103 101  CO2 24 22 22   BUN 19 11 10   CREATININE 1.34* 1.15 0.93  CALCIUM 8.1* 8.1* 8.2*  GLUCOSE 81 106* 100*    Imaging/Diagnostic Tests:   Gerrit Heck, MD 02/25/2021, 5:11 AM PGY-1, [place] Intern pager: [phone], text pages welcome

## 2021-02-26 LAB — CBC WITH DIFFERENTIAL/PLATELET
Abs Immature Granulocytes: 0.59 10*3/uL — ABNORMAL HIGH (ref 0.00–0.07)
Basophils Absolute: 0.1 10*3/uL (ref 0.0–0.1)
Basophils Relative: 1 %
Eosinophils Absolute: 0.1 10*3/uL (ref 0.0–0.5)
Eosinophils Relative: 1 %
HCT: 37.8 % — ABNORMAL LOW (ref 39.0–52.0)
Hemoglobin: 12.7 g/dL — ABNORMAL LOW (ref 13.0–17.0)
Immature Granulocytes: 5 %
Lymphocytes Relative: 9 %
Lymphs Abs: 1.1 10*3/uL (ref 0.7–4.0)
MCH: 28.9 pg (ref 26.0–34.0)
MCHC: 33.6 g/dL (ref 30.0–36.0)
MCV: 85.9 fL (ref 80.0–100.0)
Monocytes Absolute: 1.3 10*3/uL — ABNORMAL HIGH (ref 0.1–1.0)
Monocytes Relative: 11 %
Neutro Abs: 8.9 10*3/uL — ABNORMAL HIGH (ref 1.7–7.7)
Neutrophils Relative %: 73 %
Platelets: 366 10*3/uL (ref 150–400)
RBC: 4.4 MIL/uL (ref 4.22–5.81)
RDW: 13.2 % (ref 11.5–15.5)
WBC: 12.2 10*3/uL — ABNORMAL HIGH (ref 4.0–10.5)
nRBC: 0 % (ref 0.0–0.2)

## 2021-02-26 LAB — BASIC METABOLIC PANEL
Anion gap: 11 (ref 5–15)
BUN: 13 mg/dL (ref 6–20)
CO2: 23 mmol/L (ref 22–32)
Calcium: 8.6 mg/dL — ABNORMAL LOW (ref 8.9–10.3)
Chloride: 99 mmol/L (ref 98–111)
Creatinine, Ser: 0.94 mg/dL (ref 0.61–1.24)
GFR, Estimated: >60 mL/min (ref >60)
Glucose, Bld: 124 mg/dL — ABNORMAL HIGH (ref 70–99)
Potassium: 3.8 mmol/L (ref 3.5–5.1)
Sodium: 133 mmol/L — ABNORMAL LOW (ref 135–145)

## 2021-02-26 LAB — URINE CULTURE: Culture: >=100000 — AB

## 2021-02-26 MED ORDER — TAMSULOSIN HCL 0.4 MG PO CAPS: 0.8000 mg | ORAL_CAPSULE | Freq: Every day | ORAL | Status: DC

## 2021-02-26 MED ORDER — TAMSULOSIN HCL 0.4 MG PO CAPS
0.4000 mg | ORAL_CAPSULE | Freq: Once | ORAL | Status: AC
  Administered 2021-02-26: 0.4 mg via ORAL
  Filled 2021-02-26: qty 1

## 2021-02-26 MED ORDER — CEFADROXIL 500 MG PO CAPS
1000.0000 mg | ORAL_CAPSULE | Freq: Two times a day (BID) | ORAL | Status: DC
  Administered 2021-02-26 – 2021-02-27 (×3): 1000 mg via ORAL
  Filled 2021-02-26 (×4): qty 2

## 2021-02-26 MED ORDER — TAMSULOSIN HCL 0.4 MG PO CAPS
0.4000 mg | ORAL_CAPSULE | Freq: Every day | ORAL | Status: DC
  Administered 2021-02-27: 0.4 mg via ORAL
  Filled 2021-02-26: qty 1

## 2021-02-26 NOTE — Plan of Care (Signed)

## 2021-02-26 NOTE — TOC Initial Note (Addendum)
Transition of Care [facility]) - Initial/Assessment Note    Patient Details  Name: Clifford Murillo MRN: [id number] Date of Birth: [birth year]  [facility] CM/SW Contact:    Verdell Carmine, RN Phone Number: 02/26/2021, 7:52 AM  Clinical Narrative:                  Patient admitted with AKI, pyelonephritis, had foley for retention, now with condom cath  , still bladder scanning, on IV antibiotics. Patient works for city of [place]. Sister  is listed as contact Last evening family upset as the patient was still tachycardic. They stated to RN that patient is developmentally delayed., patient does have a legal guardian listed, but no contact information.   CM will follow for needs, recommendations, and transitions.   Barriers to Discharge: Continued Medical Work up   Patient Goals and CMS Choice        Expected Discharge Plan and Services     Discharge Planning Services: CM Consult   Living arrangements for the past 2 months: [place]                                      Prior Living Arrangements/Services Living arrangements for the past 2 months: [place] Lives with:: Self Patient language and need for interpreter reviewed:: Yes        Need for Family Participation in Patient Care: Yes (Comment) Care giver support system in place?: Yes (comment)   Criminal Activity/Legal Involvement Pertinent to Current Situation/Hospitalization: No - Comment as needed  Activities of Daily Living Home Assistive Devices/Equipment: None ADL Screening (condition at time of admission) Patient's cognitive ability adequate to safely complete daily activities?: Yes Is the patient deaf or have difficulty hearing?: No Does the patient have difficulty seeing, even when wearing glasses/contacts?: No Does the patient have difficulty concentrating, remembering, or making decisions?: Yes Patient able to express need for assistance with ADLs?: No Does the  patient have difficulty dressing or bathing?: No Independently performs ADLs?: Yes (appropriate for developmental age) Does the patient have difficulty walking or climbing stairs?: No Weakness of Legs: None Weakness of Arms/Hands: None  Permission Sought/Granted                  Emotional Assessment       Orientation: : Oriented to Self, Oriented to Place, Oriented to  Time, Oriented to Situation Alcohol / Substance Use: Not Applicable Psych Involvement: No (comment)  Admission diagnosis:  Tachypnea [R06.82] Urinary retention [R33.9] Pyelonephritis [N12] Tachycardia [R00.0] AKI (acute kidney injury) ([place]) [N17.9] Patient Active Problem List   Diagnosis Date Noted   Tachypnea    Urinary retention    AKI (acute kidney injury) ([place])    Tachycardia    Pyelonephritis 02/22/2021   Elevated LFTs 09/12/2014   Pericardial effusion 09/11/2014   Periorbital ecchymosis of right eye 09/11/2014   Hypokalemia 09/11/2014   Thrombocytosis 09/11/2014   Hyponatremia 09/11/2014   Subconjunctival hemorrhage of right eye    PCP:  Patient, No Pcp Per (Inactive) Pharmacy:   [id number] [address], [place] [address] Phone: [phone] Fax: [phone]     Social Determinants of Health (SDOH) Interventions    Readmission Risk Interventions No flowsheet data found.

## 2021-02-26 NOTE — Progress Notes (Signed)
FPTS Brief Progress Note  S:Patient awake in bed watching tv. No complaints at this time. Reports no pain. Voices he is looking forward to going home soon.    O: BP 115/65 (BP Location: Left Arm)   Pulse (!) 102   Temp 97.9 F (36.6 C) (Oral)   Resp 20   Ht 5\' 3"  (1.6 m)   Wt 64.8 kg   SpO2 97%   BMI 25.31 kg/m     A/P:  Pyelonephritis in the setting of urinary retention due to BPH  postrenal AKI Will follow urine output data overnight. Continue tamsulosin and finasteride. Continue cedodroxil. No changes to plan from day team.    Possible CAP  tachypnea and cough Today is day #3/3 azithromycin. Has been stable on room air throughout day. Continue IS, tessalon, robitussin.   - Orders reviewed. Labs for AM ordered, which was adjusted as needed.  - If condition changes, plan includes consult nephro.    Ezequiel Essex, MD 02/26/2021, 11:29 PM PGY-2, [place] Night Resident  Please page [phone] with questions.

## 2021-02-26 NOTE — Progress Notes (Signed)
Family Medicine Teaching Service Daily Progress Note Intern Pager: [phone]  Patient name: Clifford Murillo Medical record number: [id number] Date of birth: 12-Jun-1960 Age: 60 y.o. Gender: male  Primary Care Provider: Patient, No Pcp Per (Inactive) Consultants: None Code Status: Full  Pt Overview and Major Events to Date:  10/30 Admitted  Assessment and Plan:  Clifford Murillo is a 60 year old male admitted for decreased urine output, hesitancy and worsening back pain found to have pyelonephritis and postrenal AKI with urinary retention.  Has also been having tachypnea, tachycardia and cough in the hospital along with possible CAP.  Past medical history significant for prediabetes.  Pyelonephritis in the setting of urinary retention due to BPH  postrenal AKI Patient this morning felt normal. Abdomen was distended no rebound tenderness or flank pain. Did complain of mild suprapubic pain. Creatinine stable 0.94 from 0.93. WBC slightly up to 12.2 from 11.4. Has been afebrile. UOP 4.5L. Bladder scans were done straight cath x2 and persistent retention. Urine culture susceptibilities for klebsiella pneumoniae listed  -Encouraging oral intake  -Switch IV abx to PO cefodroxil 1g  BID per sensitivities -Tylenol prn -increased tamsulosin 0.8 mg daily -Speak with urology on recommendations and f/u. Dr. Milford Cage recommended continuing foley catheter for 10 days to 2 weeks given ED bladder scan of 750 cc. He recommended addition of finasteride and silidosin as it is specific to prostate without as much BP effects. [name] (not on a Friday)  -Replace foley catheter -CBC, BMP  Possible CAP  tachypnea and cough Respiratory rate was normal on room air.  Cough today was not present and much improved. -Azithromycin day 3/3 -Incentive spirometry -Tessalon 100 mg twice daily -robitussin  Tachycardia, resolved HR  98,102 most recently. Denies CP or feeling like his heart beats fast -Monitor  Constipation, resolved -Senna BID  FEN/GI: Regular PPx: lovenox 40 mg Dispo:Home pending clinical improvement .  Subjective:  No specific complaints today except for some suprapubic pain.  Objective: Temp:  [97.9 F (36.6 C)-99.1 F (37.3 C)] 97.9 F (36.6 C) (11/03 0411) Pulse Rate:  [98-109] 98 (11/03 0300) Resp:  [17-20] 17 (11/03 0411) BP: (103-129)/(57-82) 109/79 (11/03 0411) SpO2:  [95 %-96 %] 95 % (11/02 1548) Physical Exam: General: NAD, nontoxic Cardiovascular: RRR no m/r/g Respiratory: CTAB no w/r/c Abdomen: Distended, no rebound tenderness, no pain on palpation, BS+ Extremities: No swelling  Laboratory: Recent Labs  Lab 02/24/21 0111 02/25/21 0249 02/26/21 0212  WBC 12.0* 11.4* 12.2*  HGB 12.6* 12.6* 12.7*  HCT 38.2* 36.9* 37.8*  PLT 291 313 366   Recent Labs  Lab 02/24/21 0111 02/25/21 0249 02/26/21 0212  NA 135 132* 133*  K 3.4* 3.3* 3.8  CL 103 101 99  CO2 22 22 23   BUN 11 10 13   CREATININE 1.15 0.93 0.94  CALCIUM 8.1* 8.2* 8.6*  GLUCOSE 106* 100* 124*    Imaging/Diagnostic Tests: No results found.   Gerrit Heck, MD 02/26/2021, 7:26 AM PGY-1, [place] Intern pager: [phone], text pages welcome

## 2021-02-26 NOTE — Progress Notes (Signed)
FPTS Brief Progress Note  S:Patient asleep. Discussed with RN. No concerns.    O: BP (!) 103/57 (BP Location: Left Arm)   Pulse (!) 107   Temp 98 F (36.7 C) (Axillary)   Resp 20   Ht 5\' 3"  (1.6 m)   Wt 64.8 kg   SpO2 95%   BMI 25.31 kg/m     A/P: Pyelonephritis in setting of urinary retention due to BPH  post renal AKI Output 300 mL recorded overnight. RN reports he was retaining, replaced condom cath. Some penile irritation. No change to plan.   Tachycardia Overnight tachy to 102-107.    Possible CAP  Tachypnea and cough RR 17-20. SpO2 normal on room air. No changes to plan.    - Orders reviewed. Labs for AM ordered, which was adjusted as needed.  - If condition changes, plan includes adjust abx.   Ezequiel Essex, MD 02/26/2021, 2:52 AM PGY-2, [place] [facility] Night Resident  Please page [phone] with questions.

## 2021-02-27 LAB — BASIC METABOLIC PANEL
Anion gap: 8 (ref 5–15)
BUN: 13 mg/dL (ref 6–20)
CO2: 23 mmol/L (ref 22–32)
Calcium: 8.4 mg/dL — ABNORMAL LOW (ref 8.9–10.3)
Chloride: 100 mmol/L (ref 98–111)
Creatinine, Ser: 0.84 mg/dL (ref 0.61–1.24)
GFR, Estimated: >60 mL/min (ref >60)
Glucose, Bld: 111 mg/dL — ABNORMAL HIGH (ref 70–99)
Potassium: 3.8 mmol/L (ref 3.5–5.1)
Sodium: 131 mmol/L — ABNORMAL LOW (ref 135–145)

## 2021-02-27 LAB — CBC
HCT: 38.1 % — ABNORMAL LOW (ref 39.0–52.0)
Hemoglobin: 12.8 g/dL — ABNORMAL LOW (ref 13.0–17.0)
MCH: 28.8 pg (ref 26.0–34.0)
MCHC: 33.6 g/dL (ref 30.0–36.0)
MCV: 85.8 fL (ref 80.0–100.0)
Platelets: 354 10*3/uL (ref 150–400)
RBC: 4.44 MIL/uL (ref 4.22–5.81)
RDW: 13.1 % (ref 11.5–15.5)
WBC: 11.6 10*3/uL — ABNORMAL HIGH (ref 4.0–10.5)
nRBC: 0 % (ref 0.0–0.2)

## 2021-02-27 MED ORDER — CEFADROXIL 500 MG PO CAPS: 1000.0000 mg | ORAL_CAPSULE | Freq: Two times a day (BID) | ORAL | 0 refills | Status: DC

## 2021-02-27 MED ORDER — CEFADROXIL 500 MG PO CAPS: 1000.0000 mg | ORAL_CAPSULE | Freq: Two times a day (BID) | ORAL | 0 refills | Status: AC

## 2021-02-27 MED ORDER — TAMSULOSIN HCL 0.4 MG PO CAPS: 0.4000 mg | ORAL_CAPSULE | Freq: Every day | ORAL | 0 refills | Status: AC

## 2021-02-27 MED ORDER — FINASTERIDE 5 MG PO TABS
5.0000 mg | ORAL_TABLET | Freq: Every day | ORAL | 0 refills | Status: AC
Start: 2021-02-28 — End: ?

## 2021-02-27 MED ORDER — FINASTERIDE 5 MG PO TABS
5.0000 mg | ORAL_TABLET | Freq: Every day | ORAL | Status: DC
  Administered 2021-02-27: 5 mg via ORAL
  Filled 2021-02-27: qty 1

## 2021-02-27 NOTE — Evaluation (Signed)
Physical Therapy Evaluation Patient Details Name: Clifford Murillo MRN: [id number] DOB: Sep 08, 1960 Today's Date: 02/27/2021  History of Present Illness  Pt is a 60 year old male admitted 02/22/21 for pyelonephritis with postrenal AKI and urinary retention. PMH: prediabetes, childhood kidney stones   Clinical Impression  PT eval complete. Pt mod I bed mobility, independent transfers, and supervision ambulation 350' without AD. Steady gait noted. Mobilized on RA. Max HR 114. Max RR 36, while ambulating with mask in hallway. No SOB noted. Pt is at baseline for functional mobility. No further skilled PT intervention indicated. PT signing off.       Recommendations for follow up therapy are one component of a multi-disciplinary discharge planning process, led by the attending physician.  Recommendations may be updated based on patient status, additional functional criteria and insurance authorization.  Follow Up Recommendations No PT follow up    Assistance Recommended at Discharge None  Functional Status Assessment Patient has not had a recent decline in their functional status  Equipment Recommendations  None recommended by PT    Recommendations for Other Services       Precautions / Restrictions Precautions Precautions: None      Mobility  Bed Mobility Overal bed mobility: Modified Independent                  Transfers Overall transfer level: Independent Equipment used: None                    Ambulation/Gait Ambulation/Gait assistance: Supervision Gait Distance (Feet): 350 Feet Assistive device: None Gait Pattern/deviations: WFL(Within Functional Limits) Gait velocity: WFL Gait velocity interpretation: >2.62 ft/sec, indicative of community ambulatory General Gait Details: supervision for hallway ambulation, no assist needed, no LOB noted. Steady gait. Max HR 114. Max RR 36  Stairs            Wheelchair Mobility    Modified Rankin (Stroke  Patients Only)       Balance Overall balance assessment: No apparent balance deficits (not formally assessed)                                           Pertinent Vitals/Pain Pain Assessment: Faces Faces Pain Scale: Hurts a little bit Pain Location: L groin Pain Descriptors / Indicators: Discomfort;Grimacing Pain Intervention(s): Monitored during session;Repositioned    Home Living Family/patient expects to be discharged to:: Private residence Living Arrangements: Other relatives (brother) Available Help at Discharge: Family;Available 24 hours/day Type of Home: House Home Access: Level entry       Home Layout: One level Home Equipment: None      Prior Function Prior Level of Function : Independent/Modified Independent                     Hand Dominance        Extremity/Trunk Assessment   Upper Extremity Assessment Upper Extremity Assessment: Overall WFL for tasks assessed    Lower Extremity Assessment Lower Extremity Assessment: Overall WFL for tasks assessed    Cervical / Trunk Assessment Cervical / Trunk Assessment: Normal  Communication   Communication: No difficulties  Cognition Arousal/Alertness: Awake/alert Behavior During Therapy: WFL for tasks assessed/performed Overall Cognitive Status: Within Functional Limits for tasks assessed  General Comments General comments (skin integrity, edema, etc.): VSS on RA    Exercises     Assessment/Plan    PT Assessment Patient does not need any further PT services  PT Problem List         PT Treatment Interventions      PT Goals (Current goals can be found in the Care Plan section)  Acute Rehab PT Goals Patient Stated Goal: home today PT Goal Formulation: All assessment and education complete, DC therapy    Frequency     Barriers to discharge        Co-evaluation               AM-PAC PT "6 Clicks"  Mobility  Outcome Measure Help needed turning from your back to your side while in a flat bed without using bedrails?: None Help needed moving from lying on your back to sitting on the side of a flat bed without using bedrails?: None Help needed moving to and from a bed to a chair (including a wheelchair)?: None Help needed standing up from a chair using your arms (e.g., wheelchair or bedside chair)?: None Help needed to walk in hospital room?: A Little Help needed climbing 3-5 steps with a railing? : A Little 6 Click Score: 22    End of Session   Activity Tolerance: Patient tolerated treatment well Patient left: in chair;with call bell/phone within reach Nurse Communication: Mobility status PT Visit Diagnosis: Difficulty in walking, not elsewhere classified (R26.2)    Time: 4818-5631 PT Time Calculation (min) (ACUTE ONLY): 15 min   Charges:   PT Evaluation $PT Eval Low Complexity: 1 Low          Clifford Murillo, PT  Office # [phone] Pager [phone]   Clifford Murillo 02/27/2021, 8:24 AM

## 2021-02-27 NOTE — Evaluation (Signed)
Occupational Therapy Evaluation Patient Details Name: Clifford Murillo MRN: [id number] DOB: 09/02/1960 Today's Date: 02/27/2021   History of Present Illness Pt is a 60 year old male admitted 02/22/21 for pyelonephritis with postrenal AKI and urinary retention. PMH: prediabetes, childhood kidney stones   Clinical Impression   Pt admitted for concerns listed above. PTA pt reported that he was independent with all ADL's and IADL's. At this time, pt continues to demonstrate independence with all ADL's. He was educated on LB dressing with his catheter back, as well as maintenance and pericare. Pt motivated to continue moving and keeping up his strength. He has no further OT needs and acute OT will sign off.       Recommendations for follow up therapy are one component of a multi-disciplinary discharge planning process, led by the attending physician.  Recommendations may be updated based on patient status, additional functional criteria and insurance authorization.   Follow Up Recommendations  No OT follow up    Assistance Recommended at Discharge PRN  Functional Status Assessment  Patient has not had a recent decline in their functional status  Equipment Recommendations  None recommended by OT    Recommendations for Other Services       Precautions / Restrictions Precautions Precautions: None Restrictions Weight Bearing Restrictions: No      Mobility Bed Mobility               General bed mobility comments: Up in recliner    Transfers Overall transfer level: Independent Equipment used: None                      Balance Overall balance assessment: No apparent balance deficits (not formally assessed)                                         ADL either performed or assessed with clinical judgement   ADL Overall ADL's : Independent;At baseline                                       General ADL Comments: Pt completed dressing,  emptying his catheter bag, and transfers this sessin with no difficulties, reports he is at his baseline.     Vision Baseline Vision/History: 0 No visual deficits Ability to See in Adequate Light: 0 Adequate Patient Visual Report: No change from baseline Vision Assessment?: No apparent visual deficits     Perception     Praxis      Pertinent Vitals/Pain Pain Assessment: No/denies pain     Hand Dominance Right   Extremity/Trunk Assessment Upper Extremity Assessment Upper Extremity Assessment: Overall WFL for tasks assessed   Lower Extremity Assessment Lower Extremity Assessment: Overall WFL for tasks assessed   Cervical / Trunk Assessment Cervical / Trunk Assessment: Normal   Communication Communication Communication: No difficulties   Cognition Arousal/Alertness: Awake/alert Behavior During Therapy: WFL for tasks assessed/performed Overall Cognitive Status: Within Functional Limits for tasks assessed                                       General Comments  VSS on RA, RN took him off telemetry while OT in room    Exercises     Shoulder Instructions  Home Living Family/patient expects to be discharged to:: Private residence Living Arrangements: Other relatives Available Help at Discharge: Family;Available 24 hours/day Type of Home: House Home Access: Level entry     Home Layout: One level     Bathroom Shower/Tub: Teacher, early years/pre: Standard     Home Equipment: None          Prior Functioning/Environment Prior Level of Function : Independent/Modified Independent                        OT Problem List: Decreased activity tolerance;Decreased strength      OT Treatment/Interventions:      OT Goals(Current goals can be found in the care plan section) Acute Rehab OT Goals Patient Stated Goal: To go home OT Goal Formulation: All assessment and education complete, DC therapy Time For Goal Achievement:  02/27/21 Potential to Achieve Goals: Good  OT Frequency:     Barriers to D/C:            Co-evaluation              AM-PAC OT "6 Clicks" Daily Activity     Outcome Measure Help from another person eating meals?: None Help from another person taking care of personal grooming?: None Help from another person toileting, which includes using toliet, bedpan, or urinal?: None Help from another person bathing (including washing, rinsing, drying)?: None Help from another person to put on and taking off regular upper body clothing?: None Help from another person to put on and taking off regular lower body clothing?: None 6 Click Score: 24   End of Session Nurse Communication: Mobility status  Activity Tolerance: Patient tolerated treatment well Patient left: in chair;with call bell/phone within reach;with nursing/sitter in room  OT Visit Diagnosis: Unsteadiness on feet (R26.81);Muscle weakness (generalized) (M62.81)                Time: 8811-0315 OT Time Calculation (min): 13 min Charges:  OT General Charges $OT Visit: 1 Visit OT Evaluation $OT Eval Low Complexity: [place]., OTR/L Acute Rehabilitation  Juliyah Mergen Elane Danelia Snodgrass 02/27/2021, 2:23 PM

## 2021-02-27 NOTE — Progress Notes (Signed)
Sister, Langley Gauss, notified and in agreement of patient discharge. Shrey Boike is also legal guardian- Tasha Jindra, [phone]

## 2021-02-27 NOTE — Discharge Instructions (Addendum)
Dear [name],   Thank you so much for allowing [place] to be part of your care!  You were admitted to [facility] for a kidney infection called pyelonephritis.  You also had urinary retention due to your prostate being larger than it should be.  You were given antibiotics for this.  You also had a urinary foley catheter placed for you given your urinary retention.  You also showed to have a possible pneumonia which we gave you antibiotics for that you have completed in the hospital.  Your heart rate was elevated in the hospital but we ruled out causes such as thyroid issues, lung issues, electrolyte disturbances, and likely it was due to your infection.  Your heart rate improved greatly at the end of your hospital stay.  POST-HOSPITAL & CARE INSTRUCTIONS Please go to your urology appointment on 11/16 at 8:45 AM to have your Foley removed Please let PCP/Specialists know of any changes that were made.  Please see medications section of this packet for any medication changes.   [facility] 11/16 8:45 AM [facility]   RETURN PRECAUTIONS: If you have begin to have fevers over 101 that do not resolve with Tylenol Have trouble breathing or you cannot speak full sentences Sever abdominal pain that does not improve  Take care and be well!  [facility]  [place], [place] [address] [phone]

## 2021-02-27 NOTE — TOC Transition Note (Addendum)
Transition of Care [facility]) - CM/SW Discharge Note   Patient Details  Name: Janes Colegrove MRN: [id number] Date of Birth: 1961/01/24  Transition of Care [facility]) CM/SW Contact:  Verdell Carmine, RN Phone Number: 02/27/2021, 10:54 AM   Clinical Narrative:    Called patient on the phone due to remote. PT notes appreciated., no recommendations for [facility]. Patient states no needs at home for equipment. Patient will DC today, has a ride ( sister)he  will call when closer to DC time. Patient sees PCP at [facility], he will follow up in 1 week, on patient DC instructions. Marland Kitchen He will go home with a foley, spoke with RN ia messenger to make sure he  understood care of foley. She felt Patient had an understanding. Patient has  a legal guardian, however no number listed. Attempted to call sister to see if she had the number for April Gruneke, voicemail full. RN stated sister would pick him up at 3pm, she will ask if they have the number on arrival for LG    Final next level of care: Home/Self Care Barriers to Discharge: No Barriers Identified   Patient Goals and CMS Choice Patient states their goals for this hospitalization and ongoing recovery are:: go home - states he has no equipment or any other needs      Discharge Placement             Home self care          Discharge Plan and Services   Discharge Planning Services: CM Consult                                 Social Determinants of Health (SDOH) Interventions     Readmission Risk Interventions No flowsheet data found.

## 2021-02-27 NOTE — Progress Notes (Signed)
Family Medicine Teaching Service Daily Progress Note Intern Pager: [phone]  Patient name: Clifford Murillo Medical record number: [id number] Date of birth: 1960-06-16 Age: 60 y.o. Gender: male  Primary Care Provider: Patient, No Pcp Per (Inactive) Consultants: None Code Status: Full  Pt Overview and Major Events to Date:  10/30 admitted 11/3 void trial failed, Foley reinserted  Assessment and Plan:  Clifford Murillo is a 60 year old male he was admitted for decreased urine output, hesitancy and worsening back pain found to have  pyelonephritis and Post renal AKI with urinary retention.  Also found to have possible CAP.  Past medical history significant for prediabetes, per family developmentally delayed.  Pyelonephritis in the setting of urinary retention due to BPH  postrenal AKI Patient this morning felt fine, no complaints. Abdomen was distended similar to previously but no suprapubic pain. Creatinine is 0.84 from 0.94.  White blood cell count 11.6 from 12.2. Has remained afebrile. Urine output was 3 L over last 24 hours. -PO cefadroxil 1 g twice daily (Day 4/7 of abx) -Tylenol as needed -Tamsulosin 0.4 mg, finasteride added daily -Urology outpatient follow-up 2 weeks for -Foley removal and bladder scan -CBC BMP  Polydipsia/Polyuria Has been urinating 3-4 L daily. Net down 11.1 L without diuresis. Says he drink 5 cups of water yesterday no juice yesterday. Per nursing has been asking for water a lot. No electrolyte abnormalities. A1c 5.9 10/31. No glucose in urine on last UA. 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 -Consider Uosm outpatient  Possible CAP  tachypnea and cough Completed antibiotic course yesterday.  Cough is not present on exam. -Incentive spirometry -Tessalon -Robitussin  Tachycardia,improved Has heart rates in the 90s to low 100s.  -Monitor  FEN/GI: Regular PPx: Lovenox Dispo:Home likely today, medically stable  Subjective:  No complaints  today  Objective: Temp:  [97.4 F (36.3 C)-98.4 F (36.9 C)] 97.5 F (36.4 C) (11/04 0448) Pulse Rate:  [94-105] 95 (11/04 0448) Resp:  [20-28] 20 (11/04 0448) BP: (84-115)/(56-73) 108/72 (11/04 0448) SpO2:  [93 %-100 %] 100 % (11/04 0448) Weight:  [63.3 kg] 63.3 kg (11/04 0448) Physical Exam: General: NAD, awake and alert in chair Cardiovascular: RRR no m/r/g Respiratory: CTAB no w/r/c Abdomen: Nontender to palpation, mildly distended, BS+ Extremities: No swelling  Laboratory: Recent Labs  Lab 02/25/21 0249 02/26/21 0212 02/27/21 0150  WBC 11.4* 12.2* 11.6*  HGB 12.6* 12.7* 12.8*  HCT 36.9* 37.8* 38.1*  PLT 313 366 354   Recent Labs  Lab 02/25/21 0249 02/26/21 0212 02/27/21 0150  NA 132* 133* 131*  K 3.3* 3.8 3.8  CL 101 99 100  CO2 22 23 23   BUN 10 13 13   CREATININE 0.93 0.94 0.84  CALCIUM 8.2* 8.6* 8.4*  GLUCOSE 100* 124* 111*     Imaging/Diagnostic Tests:   Gerrit Heck, MD 02/27/2021, 7:19 AM PGY-1, [place] Intern pager: [phone], text pages welcome

## 2021-02-28 LAB — CULTURE, BLOOD (ROUTINE X 2)
Culture: NO GROWTH
Culture: NO GROWTH
Special Requests: ADEQUATE

## 2021-03-01 NOTE — Discharge Summary (Addendum)
[facility] Discharge Summary  Patient name: Clifford Murillo Medical record number: [id number] Date of birth: 1961/03/19 Age: 60 y.o. Gender: male Date of Admission: 02/22/2021  Date of Discharge: 02/27/2021 Admitting Physician: Alen Bleacher, MD  Primary Care Provider: Patient, No Pcp Per (Inactive) Consultants: None  Indication for Hospitalization: Pyelonephritis  Discharge Diagnoses/Problem List:  Thrombocytosis Pyelonephritis Tachypnea Urinary Retention AKI Tachycardia  Disposition: Home  Discharge Condition: Stable  Discharge Exam:   General: Well appearing, NAD, awake, alert, responsive to questions, sitting in chair Head: Normocephalic atraumatic CV: Regular rate and rhythm no murmurs rubs or gallops Respiratory: Clear to ausculation bilaterally, no wheezes rales or crackles, chest rises symmetrically, no increased work of breathing Abdomen: Soft, non-tender, non-distended, normoactive bowel sounds  Extremities: Moves upper and lower extremities freely, no edema in LE Neuro: No focal deficits Skin: No rashes or lesions visualized   Brief Hospital Course:  Clifford Murillo is a 60 yo male who presented with 1 week of urinary retention and 3 days of worsening lower back pain.  Right Pyelonephritisin the setting of urinary retention due to BPH  postrenal AKI He was found to be tachypneic and tachycardia on arrival to the the ED. His initial bladder scan showed 750 cc and foley was placed which yielded about 1L of urine. Patient reported significant relief after urine was removed via foley. He had leukocytosis with WBC of 14.7 and Creatinie of 14.7. UA showed >50RBC, >50 WBC. Pelvic CT showed right perinephric strain which is consistent with pyelonephritis and enlarged prostate. He was started on CTX for antibiotics which was transitioned to PO cefdroxil at the end of his hospitalization. Patient also received LR x2 and 500cc bolus. Spoke with on  call urology physician on recommendations and f/u. Dr. Milford Cage recommended continuing foley catheter for 10 days to 2 weeks given ED bladder scan of 750 cc. He recommended addition of finasteride and silidosin as it is specific to prostate without as much BP effects. [name]. Due to foley placement PSA was deferred for follow up outpatient.  Possible CAP  tachypnea and cough Patient had cough during hospitalization with tachypnea and cough. During the beginning of his hospitalization he had persistent tachycardi and had a drop in O2 which warranted additioned workup with CTA with showed no PE but small bilateral pleural effusions with PNA not excluded. He was started on azithromycin for 3 days for atypical PNA coverage. He was negative for flu a, flu b and covid. Symptomatic treatment was also performed and patient had no cough or respiratory issue at end of hospitalization on room air.   Tachycardia Patient had tachycardia persistent to 120s-140s a beginning of hospitalization. CTPE r/o CTPE, UDS on admission was positive for amphetamine but patient said he took sudafed. Magnesium, TSH and electrolytes were normal. EKGs showed sinus tachycardia. Was well hydrated as well. Tachycardia was likely due to acute infection. Improved to 90s at end of hospitalization.  Polydipsia/Polyuria Had been urinating 3-4 L daily. Net down 11.1 L without diuresis. Says he drank 3-5 cups of water a day but per nursing has been asking for water constantly. No electrolyte abnormalities. A1c 5.9 10/31. No glucose in urine on last UA. 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  Constipation Resolved with miralax and senna.   Issues for Follow Up:  Consider Uosm outpatient Will need foley catheter removal and bladder scan with  urology Consider addition of siladosin  Significant Procedures: None  Significant  Labs and Imaging:  Recent Labs  Lab 02/25/21 0249 02/26/21 0212 02/27/21 0150  WBC 11.4* 12.2* 11.6*  HGB 12.6* 12.7* 12.8*  HCT 36.9* 37.8* 38.1*  PLT 313 366 354   Recent Labs  Lab 02/22/21 2206 02/23/21 0622 02/23/21 0622 02/24/21 0111 02/25/21 0249 02/26/21 0212 02/27/21 0150  NA  --  133*  --  135 132* 133* 131*  K  --  3.6   < > 3.4* 3.3* 3.8 3.8  CL  --  100  --  103 101 99 100  CO2  --  24  --  22 22 23 23   GLUCOSE  --  81  --  106* 100* 124* 111*  BUN  --  19  --  11 10 13 13   CREATININE  --  1.34*  --  1.15 0.93 0.94 0.84  CALCIUM  --  8.1*  --  8.1* 8.2* 8.6* 8.4*  MG 2.2  --   --   --  2.3  --   --    < > = values in this interval not displayed.     Results/Tests Pending at Time of Discharge:   Discharge Medications:  Allergies as of 02/27/2021   No Known Allergies      Medication List     STOP taking these medications    pseudoephedrine 30 MG tablet Commonly known as: SUDAFED       TAKE these medications    cefadroxil 500 MG capsule Commonly known as: DURICEF Take 2 capsules (1,000 mg total) by mouth 2 (two) times daily for 3 doses.   fenofibrate 145 MG tablet Commonly known as: TRICOR Take 145 mg by mouth daily.   finasteride 5 MG tablet Commonly known as: PROSCAR Take 1 tablet (5 mg total) by mouth daily.   tamsulosin 0.4 MG Caps capsule Commonly known as: FLOMAX Take 1 capsule (0.4 mg total) by mouth daily.        Discharge Instructions: Please refer to Patient Instructions section of EMR for full details.  Patient was counseled important signs and symptoms that should prompt return to medical care, changes in medications, dietary instructions, activity restrictions, and follow up appointments.   Follow-Up Appointments:  [facility] Follow up in 1 week(s).   Why: Patient PCP, please follow up in 1 week Contact information: [place]  [address] [phone]         [facility]. Go on 03/11/2021.   Why: @ 8:45 am Contact information: [place]                Gerrit Heck, MD 03/01/2021, 5:48 PM PGY-1, [place] Upper-Level Resident Addendum   I have discussed the above with Dr. Jinny Sanders and agree with the documentation. My edits for correction/addition/clarification are included above. Please see any attending notes.   Alcus Dad, MD PGY-2, [place] [facility] 03/02/2021 8:54 AM  FPTS Service pager: [phone] (text pages welcome through [facility])

## 2021-10-30 ENCOUNTER — Ambulatory Visit (HOSPITAL_COMMUNITY)
Admission: EM | Admit: 2021-10-30 | Discharge: 2021-10-30 | Disposition: A | Discharge status: Home / Self Care | Payer: Medicare Other | Attending: Family Medicine | Admitting: Family Medicine

## 2021-10-30 ENCOUNTER — Encounter (HOSPITAL_COMMUNITY): Payer: Self-pay

## 2021-10-30 DIAGNOSIS — L237 Allergic contact dermatitis due to plants, except food: Secondary | ICD-10-CM

## 2021-10-30 MED ORDER — TRIAMCINOLONE ACETONIDE 40 MG/ML IJ SUSP
INTRAMUSCULAR | Status: AC
  Filled 2021-10-30: qty 1

## 2021-10-30 MED ORDER — PREDNISONE 20 MG PO TABS: 40.0000 mg | ORAL_TABLET | Freq: Every day | ORAL | 0 refills | Status: AC

## 2021-10-30 MED ORDER — TRIAMCINOLONE ACETONIDE 0.1 % EX CREA: 1.0000 | TOPICAL_CREAM | Freq: Two times a day (BID) | CUTANEOUS | 0 refills | Status: AC

## 2021-10-30 MED ORDER — TRIAMCINOLONE ACETONIDE 40 MG/ML IJ SUSP
40.0000 mg | Freq: Once | INTRAMUSCULAR | Status: AC
  Administered 2021-10-30: 40 mg via INTRAMUSCULAR

## 2021-10-30 NOTE — ED Provider Notes (Signed)
[place]    CSN: [id number] Arrival date & time: 10/30/21  0915      History   Chief Complaint Chief Complaint  Patient presents with   Rash    HPI Clifford Murillo is a 61 y.o. male.    Rash  Here with rash noted on both forearms this morning.  He did have some itching overnight.  No fever and no dyspnea.  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 [employer]  Past Medical History:  Diagnosis Date   CAP (community acquired pneumonia) 09/11/2014   Pericardial effusion 09/11/2014   [name] 09/11/2014    Patient Active Problem List   Diagnosis Date Noted   Tachypnea    Urinary retention    AKI (acute kidney injury) ([place])    Tachycardia    Pyelonephritis 02/22/2021   Elevated LFTs 09/12/2014   Pericardial effusion 09/11/2014   Periorbital ecchymosis of right eye 09/11/2014   Hypokalemia 09/11/2014   Thrombocytosis 09/11/2014   Hyponatremia 09/11/2014   Subconjunctival hemorrhage of right eye     Past Surgical History:  Procedure Laterality Date   INGUINAL HERNIA REPAIR Bilateral        Home Medications    Prior to Admission medications   Medication Sig Start Date End Date Taking? Authorizing Provider  fenofibrate (TRICOR) 145 MG tablet Take 145 mg by mouth daily. 02/14/21  Yes [provider]  finasteride (PROSCAR) 5 MG tablet Take 1 tablet (5 mg total) by mouth daily. 02/28/21  Yes Brimage, Vondra, DO  predniSONE (DELTASONE) 20 MG tablet Take 2 tablets (40 mg total) by mouth daily with breakfast for 5 days. 10/30/21 11/04/21 Yes Drucilla Cumber, Gwenlyn Perking, MD  triamcinolone cream (KENALOG) 0.1 % Apply 1 Application topically 2 (two) times daily. To affected area till better 10/30/21  Yes Tylin Force, Gwenlyn Perking, MD  tamsulosin (FLOMAX) 0.4 MG CAPS capsule Take 1 capsule (0.4 mg total) by mouth daily. 02/28/21   Lyndee Hensen, DO    Family History Family History  Problem Relation Age of Onset   Diabetes Mother     Social History Social  History   Tobacco Use   Smoking status: Never   Smokeless tobacco: Never  Substance Use Topics   Alcohol use: No   Drug use: No     Allergies   Patient has no known allergies.   Review of Systems Review of Systems  Skin:  Positive for rash.     Physical Exam Triage Vital Signs ED Triage Vitals  Enc Vitals Group     BP 10/30/21 1048 127/86     Pulse Rate 10/30/21 1048 75     Resp 10/30/21 1048 16     Temp 10/30/21 1048 97.8 F (36.6 C)     Temp Source 10/30/21 1048 Oral     SpO2 10/30/21 1048 98 %     Weight 10/30/21 1050 139 lb 8.8 oz (63.3 kg)     Height --      Head Circumference --      Peak Flow --      Pain Score 10/30/21 1050 0     Pain Loc --      Pain Edu? --      Excl. in [place]? --    No data found.  Updated Vital Signs BP 127/86 (BP Location: Right Arm)   Pulse 75   Temp 97.8 F (36.6 C) (Oral)   Resp 16   Wt 63.3 kg   SpO2 98%  BMI 24.72 kg/m   Visual Acuity Right Eye Distance:   Left Eye Distance:   Bilateral Distance:    Right Eye Near:   Left Eye Near:    Bilateral Near:     Physical Exam Vitals reviewed.  Constitutional:      General: He is not in acute distress.    Appearance: He is not toxic-appearing.  HENT:     Mouth/Throat:     Mouth: Mucous membranes are moist.  Eyes:     Extraocular Movements: Extraocular movements intact.     Pupils: Pupils are equal, round, and reactive to light.  Cardiovascular:     Rate and Rhythm: Normal rate and regular rhythm.  Pulmonary:     Effort: Pulmonary effort is normal.     Breath sounds: Normal breath sounds.  Skin:    Comments: There is a maculopapular rash on the extensor surfaces of both forearms.  No drainage or swelling  Neurological:     Mental Status: He is alert and oriented to person, place, and time.  Psychiatric:        Behavior: Behavior normal.      UC Treatments / Results  Labs (all labs ordered are listed, but only abnormal results are displayed) Labs  Reviewed - No data to display  EKG   Radiology No results found.  Procedures Procedures (including critical care time)  Medications Ordered in UC Medications  triamcinolone acetonide (KENALOG-40) injection 40 mg (has no administration in time range)    Initial Impression / Assessment and Plan / UC Course  I have reviewed the triage vital signs and the nursing notes.  Pertinent labs & imaging results that were available during my care of the patient were reviewed by me and considered in my medical decision making (see chart for details).     I will treat for contact dermatitis with triamcinolone cream, and injection of triamcinolone, and prednisone for 5 days Final Clinical Impressions(s) / UC Diagnoses   Final diagnoses:  Allergic contact dermatitis due to plants, except food     Discharge Instructions      You have been given a shot of triamcinolone 40 mg  Take prednisone 20 mg--2 daily for 5 days  Triamcinolone cream--applied to the rash twice daily until better.  You can take Benadryl at night for the itching     ED Prescriptions     Medication Sig Dispense Auth. Provider   triamcinolone cream (KENALOG) 0.1 % Apply 1 Application topically 2 (two) times daily. To affected area till better 80 g Barrett Henle, MD   predniSONE (DELTASONE) 20 MG tablet Take 2 tablets (40 mg total) by mouth daily with breakfast for 5 days. 10 tablet Windy Carina Gwenlyn Perking, MD      PDMP not reviewed this encounter.   Barrett Henle, MD 10/30/21 [phone]

## 2021-10-30 NOTE — Discharge Instructions (Addendum)
You have been given a shot of triamcinolone 40 mg  Take prednisone 20 mg--2 daily for 5 days  Triamcinolone cream--applied to the rash twice daily until better.  You can take Benadryl at night for the itching

## 2021-10-30 NOTE — ED Triage Notes (Signed)
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 Notice rash on the right forearm on Wednesday. Multiple itch bumps on the arm. Patient thinks it is poison ivy.

## 2022-06-10 IMAGING — DX DG CHEST 1V PORT
1 series · 1 of 1 positions shown · non-contrast
Comparison: 09/11/2014; chest CT-09/11/2014

CLINICAL DATA: Tachypnea.  Nasal congestion.  Occasional dry cough.

EXAM:
PORTABLE CHEST 1 VIEW

[chest ap]
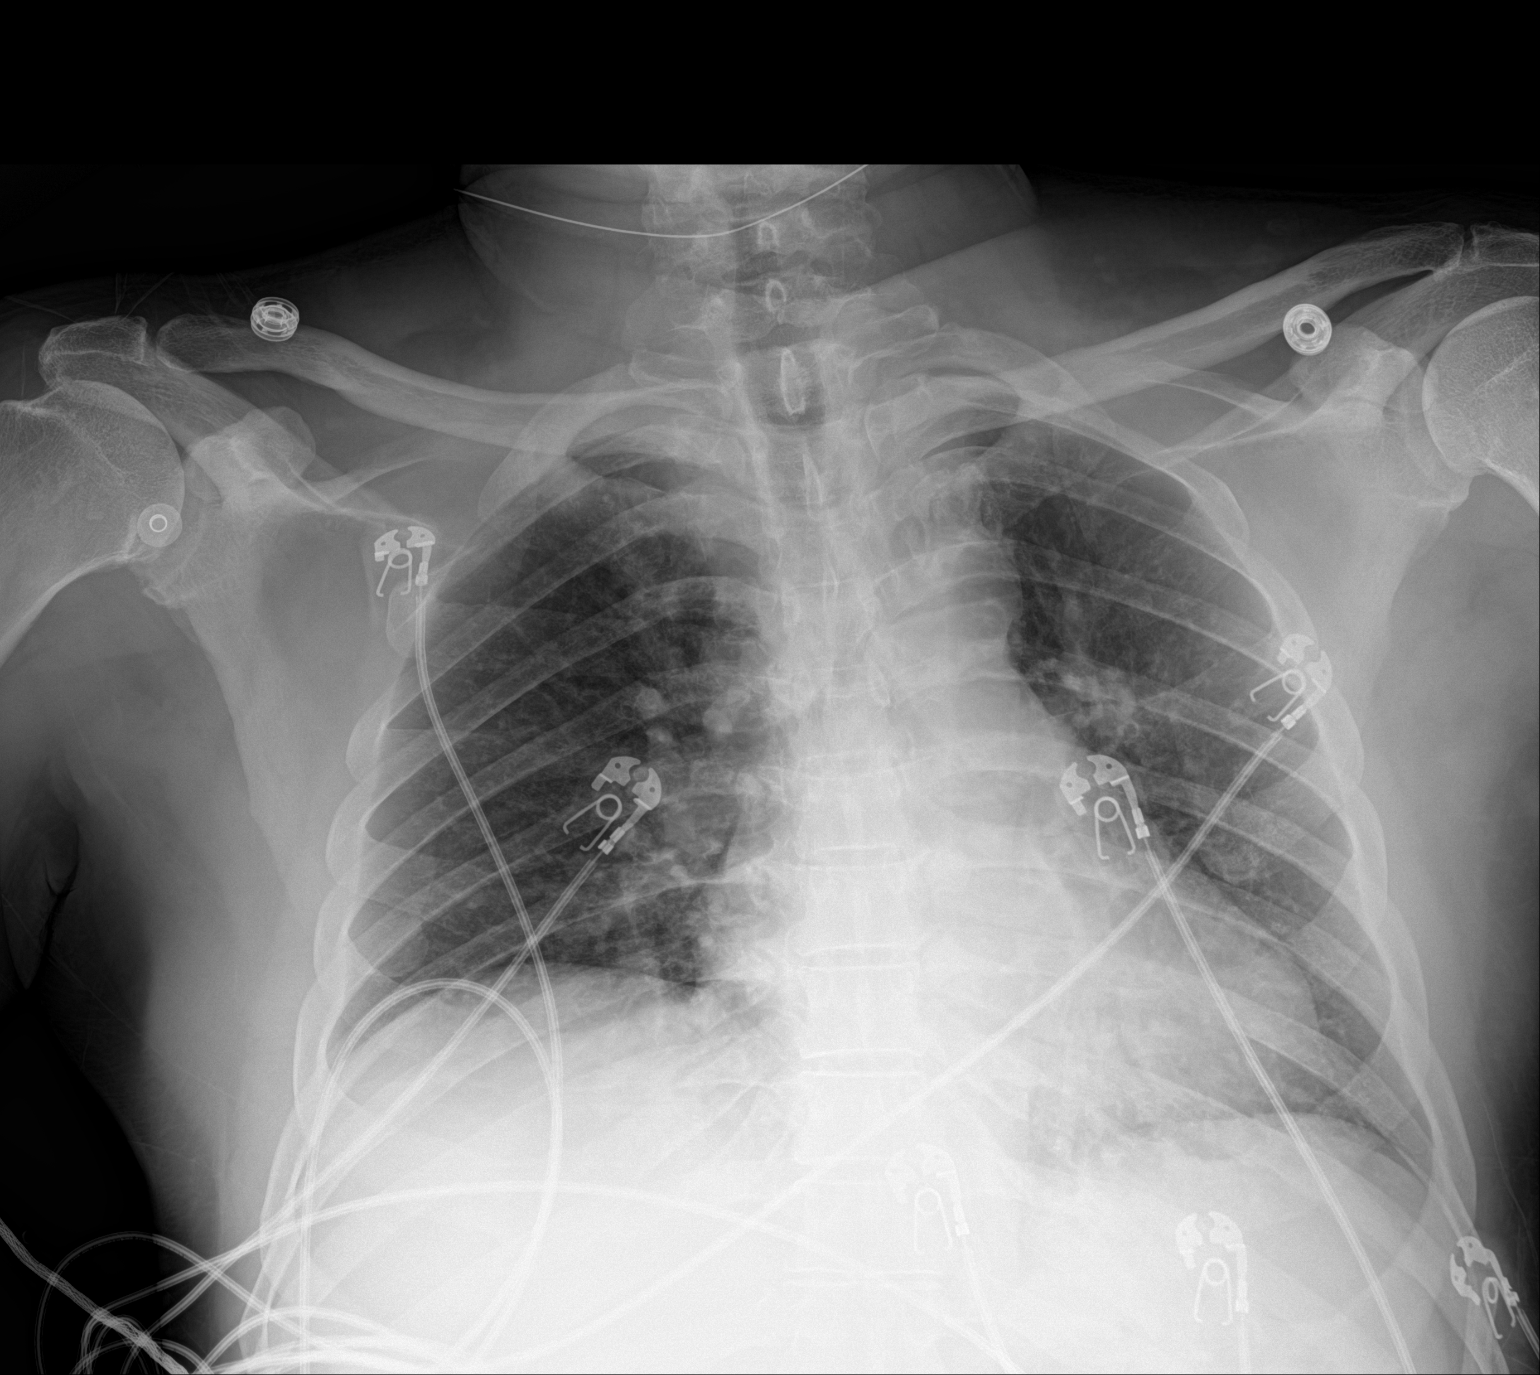

[1 of 1 positions shown; findings below may reference images not displayed]

FINDINGS: Grossly unchanged cardiac silhouette and mediastinal contours. There
is mild elevation/eventration of the right hemidiaphragm. No focal
airspace opacities. No pleural effusion or pneumothorax. Mild
pulmonary venous congestion without definite evidence of edema. No
acute osseous abnormalities.
IMPRESSION: Mild pulmonary venous congestion without frank evidence of edema.
Further evaluation with a PA and lateral chest radiograph may be
obtained as clinically indicated.

## 2022-06-11 IMAGING — CT CT ANGIO CHEST
2 of 7 series · 17 of 46 positions shown · IV contrast (APPLIED)
Comparison: Chest CT dated 09/11/2014.

CLINICAL DATA: Concern for pulmonary embolism.

EXAM:
CT ANGIOGRAPHY CHEST WITH CONTRAST
TECHNIQUE: Multidetector CT imaging of the chest was performed using the
standard protocol during bolus administration of intravenous
contrast. Multiplanar CT image reconstructions and MIPs were
obtained to evaluate the vascular anatomy.
CONTRAST:  55mL OMNIPAQUE IOHEXOL 350 MG/ML SOLN

[Series 7: thins · axial · 0.61mm/px · z∈[+1256,+1459]mm · 14 of 329 slices shown]
[im 19/329  lung]
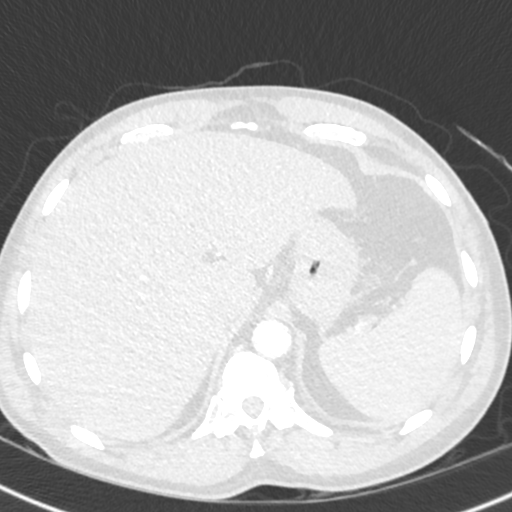
[im 37/329  soft-tissue]
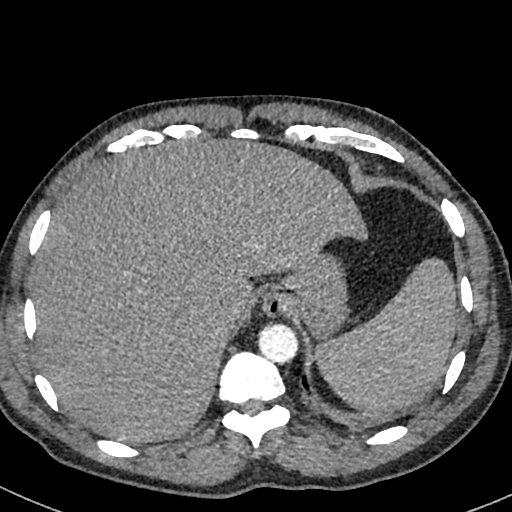
[im 73/329  lung]
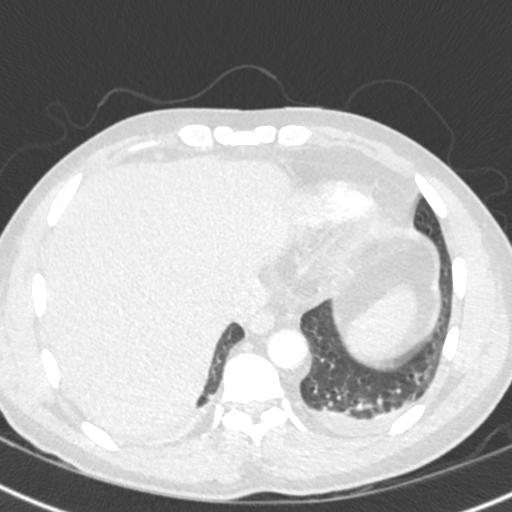
[im 92/329  soft-tissue]
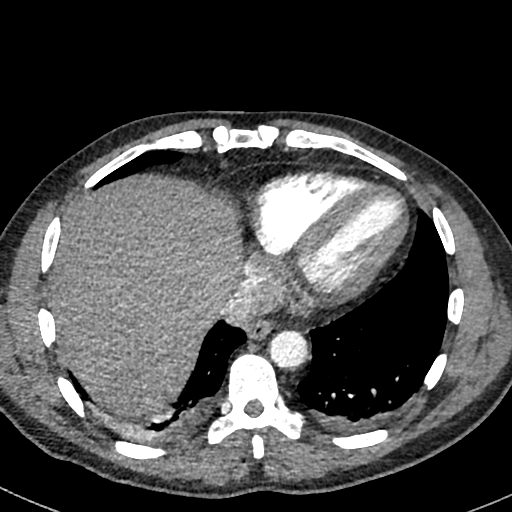
[im 110/329  lung]
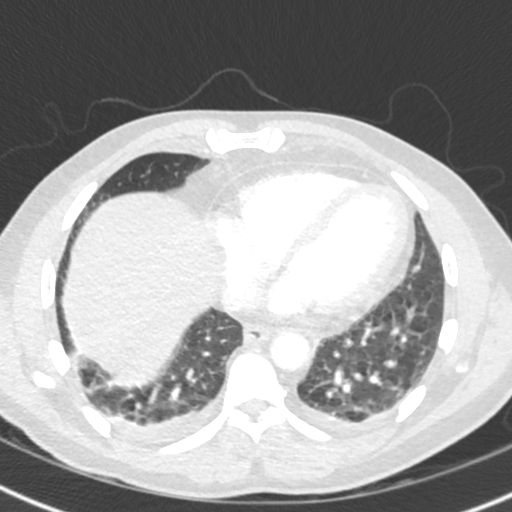
[im 128/329  soft-tissue]
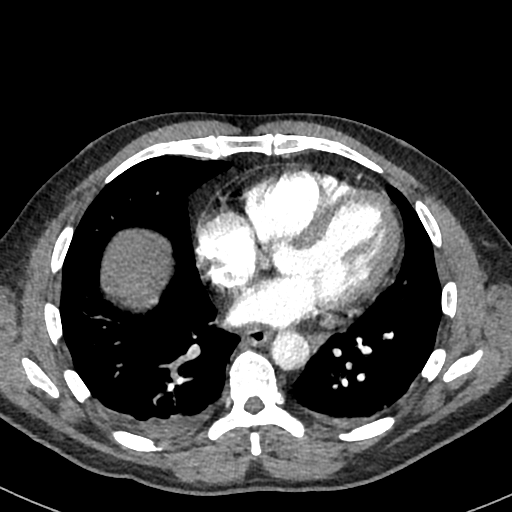
[im 146/329  lung]
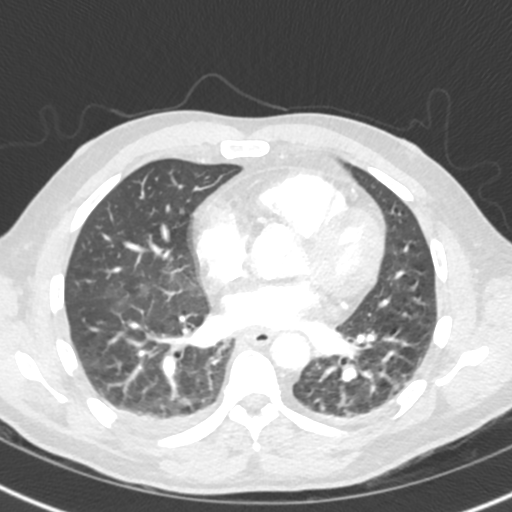
[im 183/329  soft-tissue]
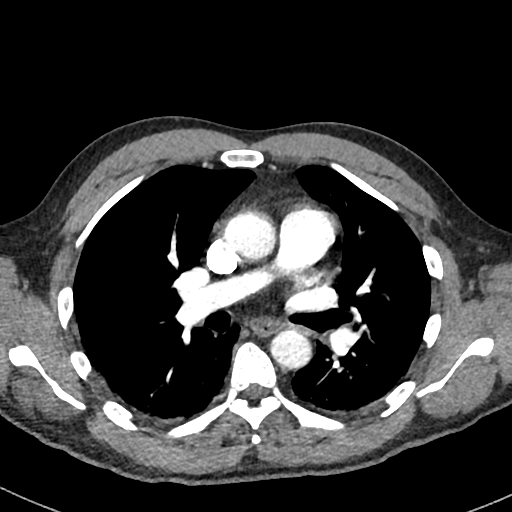
[im 201/329  lung]
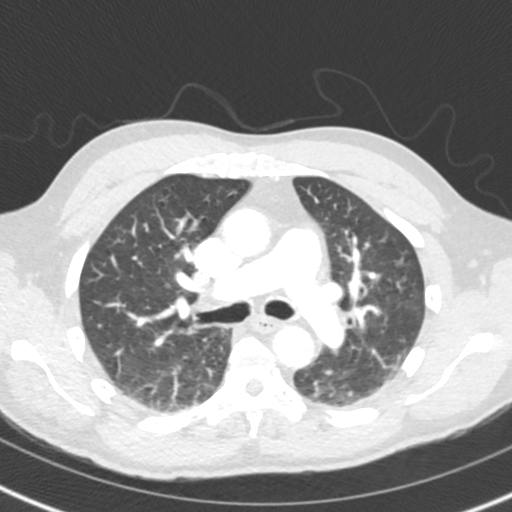
[im 219/329  soft-tissue]
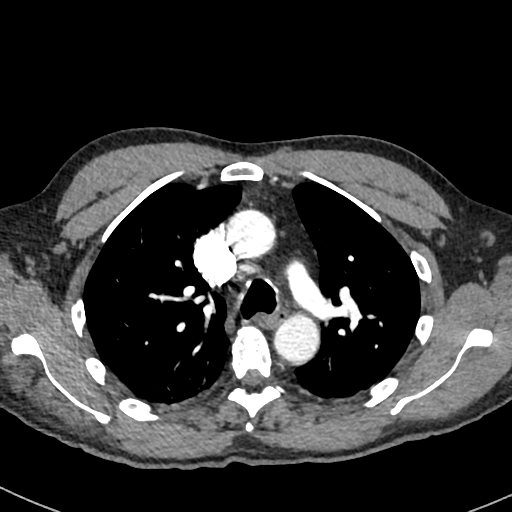
[im 237/329  lung]
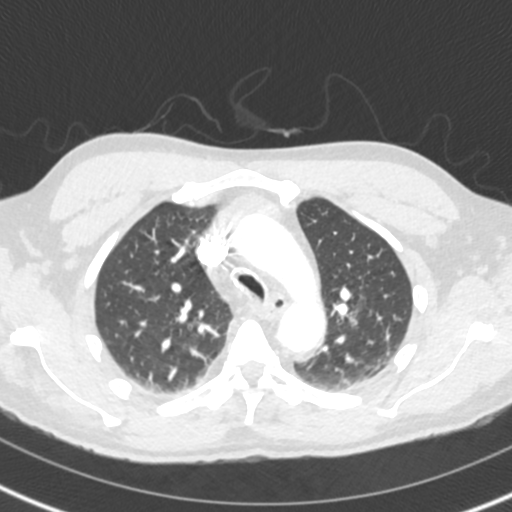
[im 256/329  soft-tissue]
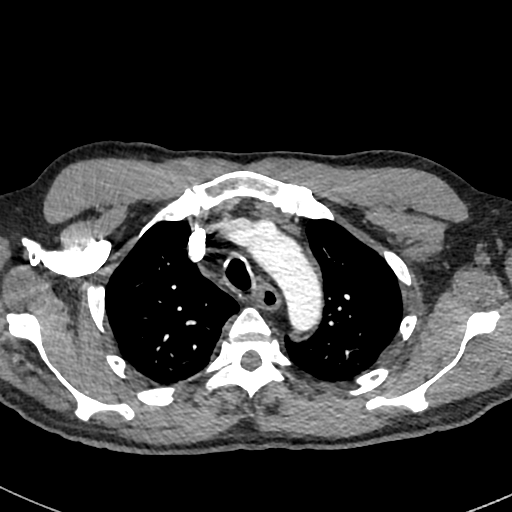
[im 292/329  lung]
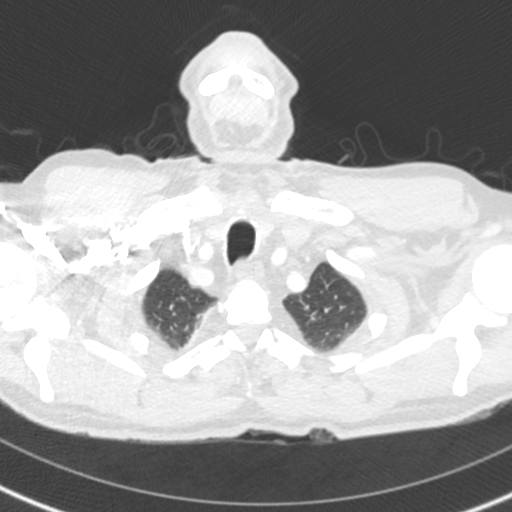
[im 310/329  soft-tissue]
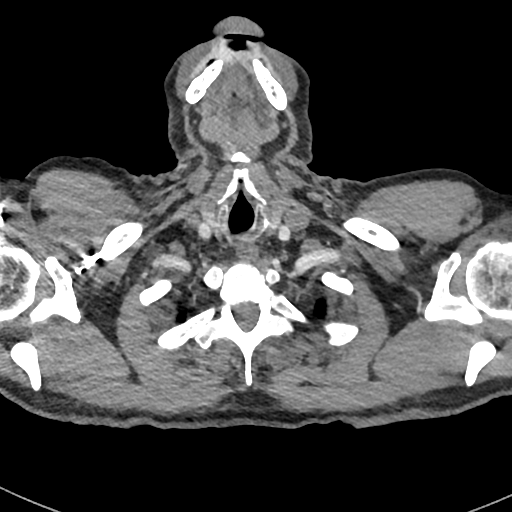

[Series 8: cor · coronal · 0.47mm/px · 3 of 150 slices shown]
[im 38/150  soft-tissue]
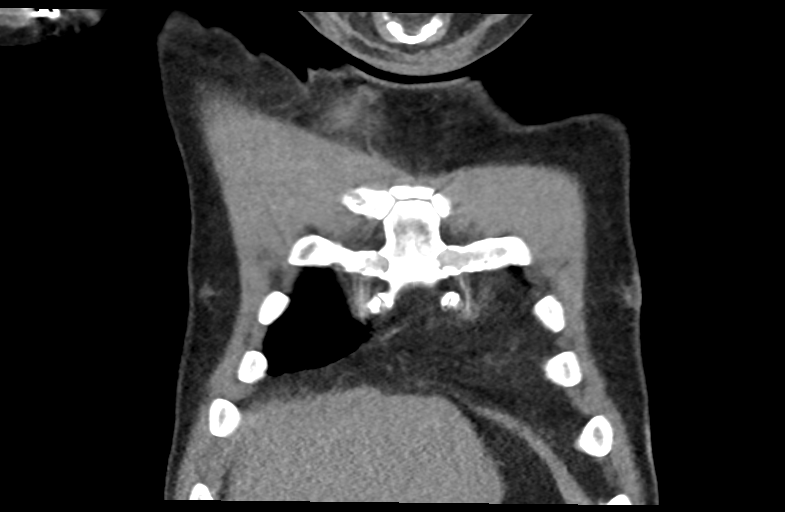
[im 75/150  soft-tissue]
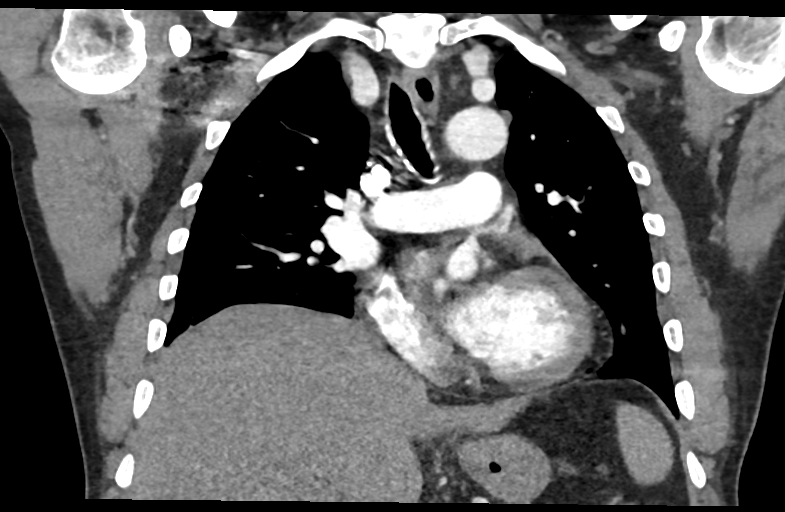
[im 112/150  soft-tissue]
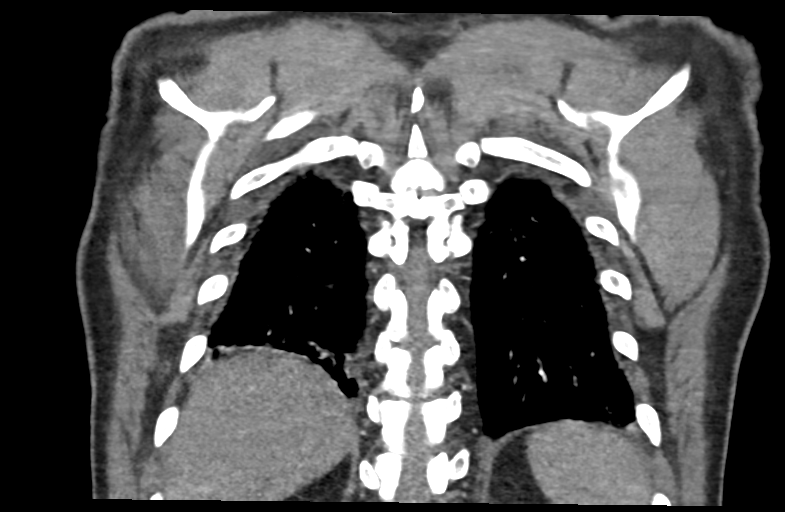

[17 of 46 positions shown; findings below may reference images not displayed]

FINDINGS: Cardiovascular: There is no cardiomegaly or pericardial effusion.
The thoracic aorta is unremarkable. The origins of the great vessels
of the aortic arch appear patent. No pulmonary artery embolus
identified.

Mediastinum/Nodes: There is no hilar or mediastinal adenopathy. The
esophagus is grossly unremarkable. No mediastinal fluid collection.

Lungs/Pleura: Small bilateral pleural effusions. There is partial
compressive atelectasis of the lower lobes. Pneumonia is not
excluded clinical correlation is recommended. No pneumothorax. The
central airways are patent.

Upper Abdomen: No acute abnormality.

Musculoskeletal: No chest wall abnormality. No acute or significant
osseous findings.

Review of the MIP images confirms the above findings.
IMPRESSION: 1. No CT evidence of pulmonary embolism.
2. Small bilateral pleural effusions with partial compressive
atelectasis of the lower lobes. Pneumonia is not excluded.

## 2022-08-13 LAB — GLUCOSE, POCT (MANUAL RESULT ENTRY): Glucose Fasting, POC: 139 mg/dL — AB (ref 70–99)

## 2022-08-13 LAB — HEMOGLOBIN A1C: Hemoglobin A1C: 5.9

## 2022-08-13 NOTE — Progress Notes (Signed)
SDOH present for Food, Transportation and Intimate Partner Violence.  IPV is at high risk.

## 2022-08-30 ENCOUNTER — Encounter: Payer: Self-pay | Admitting: *Deleted

## 2022-08-30 NOTE — Progress Notes (Addendum)
Pt attended 08/13/22 event where blood sugar was 139 and A1c was 5.9, b/p was 121/88. Pt noted at health screening that he did not have PCP and identified food, housing, and IPV insecurities. Calls made to pt to offer assistance in access to PCP and to provide respective SDOH resource support; however, pt stated on phone call that "I'm good" and did not want information about connecting with a PCP. When asked if caller could support/help him with any personal safety issues, as pt had mentioned at event, pt denied any need for resources or protective intervention. Pt thanked calller for the f/u and caller instructed pt to save this callers phone number in case needed for help with PCP, insurance, or personal safety in the future, to which pt replied "thank you." No additional health equity team support indicated at this time, per pt's phone comments.

## 2022-10-21 ENCOUNTER — Encounter: Payer: Self-pay | Admitting: *Deleted

## 2022-10-21 NOTE — Progress Notes (Signed)
Pt attended 08/13/22 screening event where his b/p was 121/88; his blood sugar was 139; and his A1C was 5.9. At the event, the pt identified food and transportation insecurities and did not identify a PCP. During first event f/u phone call, pt denied needing additional SDOH support and did not want any PCP info.During current 2nd event f/u attempts, neither pt nor his legal guardian (sister) could be contacted by phone (no answer and vm full). Per chart review, former care notes indicated that pt had PCP at [facility] in 2022 but no current evidence of PCP per [facility] documentation. [facility] and [facility] flyers mailed with letter to pt in case PCP access needed now.

## 2023-02-22 ENCOUNTER — Encounter: Payer: Self-pay | Admitting: *Deleted

## 2023-02-22 NOTE — Progress Notes (Signed)
Pt attended 08/13/22 screening event where his b/p was 121/88 and his blood sugar was 139 and his A1C was 5.9. At the event pt notes a food and transportation insecurity and IPV and was given resources at the event. During the initial f/u call with pt, pt denied any SDOH and stated he was "good." Chart review did not indicate any [facility] visible PCP encounters. However, 2022 ED/admission notes mentioned [facility] on [address] as a previous PCP. During 60 day event f/u, health equity team unable to contact pt or guardian sister. During f/u today, team member able to connect pt's and guardian's sister, who confirmed pt did not have any SDOH needs and team member called the [facility] on [address] and was able to confirm pt's PCP is Dr. Salli Real and PCP last saw pt on 02/04/23. No additional health equity team support indicated at this time.
